# Patient Record
Sex: Female | Born: 2000 | Race: White | Hispanic: No | Marital: Single | Smoking: Never smoker
Health system: Southern US, Community
[De-identification: ages and names within clinical notes are randomized; demographics above are authoritative.]

## PROBLEM LIST (undated history)

## (undated) DIAGNOSIS — Z789 Other specified health status: Secondary | ICD-10-CM

## (undated) HISTORY — PX: NO PAST SURGERIES: SHX2092

---

## 2019-07-05 ENCOUNTER — Other Ambulatory Visit: Payer: Self-pay

## 2019-07-05 DIAGNOSIS — Z20822 Contact with and (suspected) exposure to covid-19: Secondary | ICD-10-CM

## 2019-07-07 LAB — NOVEL CORONAVIRUS, NAA: SARS-CoV-2, NAA: NOT DETECTED

## 2019-09-26 ENCOUNTER — Ambulatory Visit: Payer: Managed Care, Other (non HMO) | Attending: Internal Medicine

## 2019-09-26 ENCOUNTER — Other Ambulatory Visit: Payer: Self-pay

## 2019-09-26 DIAGNOSIS — Z20822 Contact with and (suspected) exposure to covid-19: Secondary | ICD-10-CM | POA: Insufficient documentation

## 2019-09-28 LAB — NOVEL CORONAVIRUS, NAA: SARS-CoV-2, NAA: NOT DETECTED

## 2019-10-13 ENCOUNTER — Ambulatory Visit: Payer: Managed Care, Other (non HMO) | Attending: Internal Medicine

## 2019-10-13 ENCOUNTER — Other Ambulatory Visit: Payer: Self-pay

## 2019-10-13 DIAGNOSIS — Z20822 Contact with and (suspected) exposure to covid-19: Secondary | ICD-10-CM | POA: Insufficient documentation

## 2019-10-14 LAB — NOVEL CORONAVIRUS, NAA: SARS-CoV-2, NAA: NOT DETECTED

## 2019-12-30 ENCOUNTER — Emergency Department (HOSPITAL_COMMUNITY)
Admission: EM | Admit: 2019-12-30 | Discharge: 2019-12-31 | Disposition: A | Discharge status: Home / Self Care | Payer: Managed Care, Other (non HMO) | Attending: Emergency Medicine | Admitting: Emergency Medicine

## 2019-12-30 ENCOUNTER — Other Ambulatory Visit: Payer: Self-pay

## 2019-12-30 ENCOUNTER — Encounter (HOSPITAL_COMMUNITY): Payer: Self-pay

## 2019-12-30 DIAGNOSIS — R42 Dizziness and giddiness: Secondary | ICD-10-CM | POA: Diagnosis not present

## 2019-12-30 DIAGNOSIS — N390 Urinary tract infection, site not specified: Secondary | ICD-10-CM | POA: Diagnosis not present

## 2019-12-30 DIAGNOSIS — R300 Dysuria: Secondary | ICD-10-CM | POA: Insufficient documentation

## 2019-12-30 DIAGNOSIS — N760 Acute vaginitis: Secondary | ICD-10-CM | POA: Diagnosis not present

## 2019-12-30 DIAGNOSIS — R1030 Lower abdominal pain, unspecified: Secondary | ICD-10-CM | POA: Diagnosis not present

## 2019-12-30 DIAGNOSIS — R102 Pelvic and perineal pain: Secondary | ICD-10-CM

## 2019-12-30 DIAGNOSIS — R110 Nausea: Secondary | ICD-10-CM | POA: Diagnosis not present

## 2019-12-30 DIAGNOSIS — K5900 Constipation, unspecified: Secondary | ICD-10-CM | POA: Diagnosis not present

## 2019-12-30 LAB — CBC
HCT: 37.8 % (ref 36.0–46.0)
Hemoglobin: 12.1 g/dL (ref 12.0–15.0)
MCH: 28.2 pg (ref 26.0–34.0)
MCHC: 32 g/dL (ref 30.0–36.0)
MCV: 88.1 fL (ref 80.0–100.0)
Platelets: 319 10*3/uL (ref 150–400)
RBC: 4.29 MIL/uL (ref 3.87–5.11)
RDW: 12.9 % (ref 11.5–15.5)
WBC: 9.5 10*3/uL (ref 4.0–10.5)
nRBC: 0 % (ref 0.0–0.2)

## 2019-12-30 LAB — URINALYSIS, ROUTINE W REFLEX MICROSCOPIC
Bacteria, UA: NONE SEEN
Bilirubin Urine: NEGATIVE
Glucose, UA: NEGATIVE mg/dL
Hgb urine dipstick: NEGATIVE
Ketones, ur: NEGATIVE mg/dL
Nitrite: NEGATIVE
Protein, ur: NEGATIVE mg/dL
Specific Gravity, Urine: 1.019 (ref 1.005–1.030)
pH: 5 (ref 5.0–8.0)

## 2019-12-30 LAB — BASIC METABOLIC PANEL
Anion gap: 13 (ref 5–15)
BUN: 11 mg/dL (ref 6–20)
CO2: 22 mmol/L (ref 22–32)
Calcium: 9.7 mg/dL (ref 8.9–10.3)
Chloride: 102 mmol/L (ref 98–111)
Creatinine, Ser: 0.89 mg/dL (ref 0.44–1.00)
GFR calc Af Amer: >60 mL/min (ref >60)
GFR calc non Af Amer: >60 mL/min (ref >60)
Glucose, Bld: 104 mg/dL — ABNORMAL HIGH (ref 70–99)
Potassium: 4.1 mmol/L (ref 3.5–5.1)
Sodium: 137 mmol/L (ref 135–145)

## 2019-12-30 LAB — I-STAT BETA HCG BLOOD, ED (MC, WL, AP ONLY): I-stat hCG, quantitative: <5 m[IU]/mL (ref <5)

## 2019-12-30 MED ORDER — SODIUM CHLORIDE 0.9% FLUSH: 3.0000 mL | Freq: Once | INTRAVENOUS | Status: DC

## 2019-12-30 NOTE — ED Notes (Signed)
States was going to car to get sweatshirt.

## 2019-12-30 NOTE — ED Triage Notes (Signed)
Pt reports that she was at work and then began to have pelvic pain, dysuria and hot flashes and some nausea feeling like she might pass out.

## 2019-12-31 ENCOUNTER — Emergency Department (HOSPITAL_COMMUNITY): Payer: Managed Care, Other (non HMO)

## 2019-12-31 ENCOUNTER — Encounter (HOSPITAL_COMMUNITY): Payer: Self-pay | Admitting: Emergency Medicine

## 2019-12-31 LAB — WET PREP, GENITAL
Sperm: NONE SEEN
Trich, Wet Prep: NONE SEEN
Yeast Wet Prep HPF POC: NONE SEEN

## 2019-12-31 MED ORDER — NITROFURANTOIN MONOHYD MACRO 100 MG PO CAPS
100.0000 mg | ORAL_CAPSULE | Freq: Two times a day (BID) | ORAL | 0 refills | Status: DC
Start: 2019-12-31 — End: 2023-02-21

## 2019-12-31 MED ORDER — NAPROXEN 250 MG PO TABS
500.0000 mg | ORAL_TABLET | Freq: Once | ORAL | Status: AC
  Administered 2019-12-31: 500 mg via ORAL
  Filled 2019-12-31: qty 2

## 2019-12-31 MED ORDER — NITROFURANTOIN MONOHYD MACRO 100 MG PO CAPS
100.0000 mg | ORAL_CAPSULE | Freq: Once | ORAL | Status: AC
  Administered 2019-12-31: 100 mg via ORAL
  Filled 2019-12-31: qty 1

## 2019-12-31 MED ORDER — METRONIDAZOLE 500 MG PO TABS
500.0000 mg | ORAL_TABLET | Freq: Two times a day (BID) | ORAL | 0 refills | Status: DC
Start: 2019-12-31 — End: 2023-02-21

## 2019-12-31 MED ORDER — ACETAMINOPHEN 325 MG PO TABS
650.0000 mg | ORAL_TABLET | Freq: Once | ORAL | Status: AC
  Administered 2019-12-31: 650 mg via ORAL
  Filled 2019-12-31: qty 2

## 2019-12-31 MED ORDER — METRONIDAZOLE 500 MG PO TABS
500.0000 mg | ORAL_TABLET | Freq: Once | ORAL | Status: AC
  Administered 2019-12-31: 500 mg via ORAL
  Filled 2019-12-31: qty 1

## 2019-12-31 NOTE — ED Notes (Signed)
Patient verbalizes understanding of discharge instructions. Opportunity for questioning and answers were provided. Armband removed by staff, pt discharged from ED. Pt. ambulatory and discharged home.  

## 2019-12-31 NOTE — ED Provider Notes (Signed)
[facility] EMERGENCY DEPARTMENT Provider Note   CSN: [id number] Arrival date & time: 12/30/19  1951     History Chief Complaint  Patient presents with  . Pelvic Pain  . Near Syncope    Paula Lang is a 19 y.o. female.  The history is provided by the patient.  Abdominal Pain Pain location:  Suprapubic Pain quality: aching   Pain radiates to:  Anus Pain severity:  Moderate Onset quality:  Sudden Timing:  Constant Progression:  Improving Chronicity:  New Context: not retching, not sick contacts and not suspicious food intake   Relieved by:  Nothing Worsened by:  Nothing Ineffective treatments:  None tried Associated symptoms: dysuria and nausea   Associated symptoms: no anorexia, no chest pain, no chills, no cough, no diarrhea, no flatus, no hematemesis, no hematochezia, no hematuria, no vaginal bleeding, no vaginal discharge and no vomiting   Risk factors: pregnancy   Risk factors: has not had multiple surgeries        History reviewed. No pertinent past medical history.  There are no problems to display for this patient.   History reviewed. No pertinent surgical history.   OB History   No obstetric history on file.     History reviewed. No pertinent family history.  Social History   Tobacco Use  . Smoking status: Never Smoker  . Smokeless tobacco: Never Used  Substance Use Topics  . Alcohol use: Never  . Drug use: Yes    Types: Marijuana    Home Medications Prior to Admission medications   Not on File    Allergies    Patient has no known allergies.  Review of Systems   Review of Systems  Constitutional: Negative for chills.  HENT: Negative for congestion.   Eyes: Negative for visual disturbance.  Respiratory: Negative for cough.   Cardiovascular: Negative for chest pain.  Gastrointestinal: Positive for abdominal pain and nausea. Negative for anorexia, diarrhea, flatus, hematemesis, hematochezia and vomiting.    Genitourinary: Positive for dysuria and pelvic pain. Negative for flank pain, hematuria, vaginal bleeding and vaginal discharge.  Musculoskeletal: Negative for arthralgias.  Skin: Negative for rash.  Neurological: Positive for light-headedness. Negative for dizziness.  Psychiatric/Behavioral: Negative for agitation.  All other systems reviewed and are negative.   Physical Exam Updated Vital Signs BP 123/78   Pulse 71   Temp 98.4 F (36.9 C) (Oral)   Resp 18   LMP 12/18/2019   SpO2 100%   Physical Exam Vitals and nursing note reviewed. Exam conducted with a chaperone present.  Constitutional:      General: She is not in acute distress.    Appearance: Normal appearance.  HENT:     Head: Normocephalic and atraumatic.     Nose: Nose normal.  Eyes:     Pupils: Pupils are equal, round, and reactive to light.  Cardiovascular:     Rate and Rhythm: Normal rate and regular rhythm.     Pulses: Normal pulses.     Heart sounds: Normal heart sounds.  Pulmonary:     Effort: Pulmonary effort is normal.     Breath sounds: Normal breath sounds.  Abdominal:     General: Abdomen is flat. Bowel sounds are normal.     Tenderness: There is no abdominal tenderness. There is no guarding.  Genitourinary:    Vagina: No vaginal discharge.     Cervix: Discharge and cervical bleeding present. No cervical motion tenderness or friability.     Adnexa: Right adnexa  normal and left adnexa normal.  Musculoskeletal:     Cervical back: Normal range of motion and neck supple.  Skin:    General: Skin is warm and dry.     Capillary Refill: Capillary refill takes less than 2 seconds.  Neurological:     General: No focal deficit present.     Mental Status: She is alert and oriented to person, place, and time.     Deep Tendon Reflexes: Reflexes normal.  Psychiatric:        Mood and Affect: Mood normal.        Behavior: Behavior normal.     ED Results / Procedures / Treatments   Labs (all labs  ordered are listed, but only abnormal results are displayed) Results for orders placed or performed during the hospital encounter of 12/30/19  Wet prep, genital   Specimen: Cervix  Result Value Ref Range   Yeast Wet Prep HPF POC NONE SEEN NONE SEEN   Trich, Wet Prep NONE SEEN NONE SEEN   Clue Cells Wet Prep HPF POC PRESENT (A) NONE SEEN   WBC, Wet Prep HPF POC FEW (A) NONE SEEN   Sperm NONE SEEN   Basic metabolic panel  Result Value Ref Range   Sodium 137 135 - 145 mmol/L   Potassium 4.1 3.5 - 5.1 mmol/L   Chloride 102 98 - 111 mmol/L   CO2 22 22 - 32 mmol/L   Glucose, Bld 104 (H) 70 - 99 mg/dL   BUN 11 6 - 20 mg/dL   Creatinine, Ser 9.39 0.44 - 1.00 mg/dL   Calcium 9.7 8.9 - 03.0 mg/dL   GFR calc non Af Amer >60 >60 mL/min   GFR calc Af Amer >60 >60 mL/min   Anion gap 13 5 - 15  CBC  Result Value Ref Range   WBC 9.5 4.0 - 10.5 K/uL   RBC 4.29 3.87 - 5.11 MIL/uL   Hemoglobin 12.1 12.0 - 15.0 g/dL   HCT 09.2 33.0 - 07.6 %   MCV 88.1 80.0 - 100.0 fL   MCH 28.2 26.0 - 34.0 pg   MCHC 32.0 30.0 - 36.0 g/dL   RDW 22.6 33.3 - 54.5 %   Platelets 319 150 - 400 K/uL   nRBC 0.0 0.0 - 0.2 %  Urinalysis, Routine w reflex microscopic  Result Value Ref Range   Color, Urine YELLOW YELLOW   APPearance HAZY (A) CLEAR   Specific Gravity, Urine 1.019 1.005 - 1.030   pH 5.0 5.0 - 8.0   Glucose, UA NEGATIVE NEGATIVE mg/dL   Hgb urine dipstick NEGATIVE NEGATIVE   Bilirubin Urine NEGATIVE NEGATIVE   Ketones, ur NEGATIVE NEGATIVE mg/dL   Protein, ur NEGATIVE NEGATIVE mg/dL   Nitrite NEGATIVE NEGATIVE   Leukocytes,Ua SMALL (A) NEGATIVE   WBC, UA 11-20 0 - 5 WBC/hpf   Bacteria, UA NONE SEEN NONE SEEN   Squamous Epithelial / LPF 0-5 0 - 5  I-Stat beta hCG blood, ED  Result Value Ref Range   I-stat hCG, quantitative <5.0 <5 mIU/mL   Comment 3           DG Abdomen Acute W/Chest  Result Date: 12/31/2019 CLINICAL DATA:  Lower abdominal pain, dysuria and hot flashes EXAM: DG ABDOMEN ACUTE W/  1V CHEST COMPARISON:  None. FINDINGS: No consolidation, features of edema, pneumothorax, or effusion. Pulmonary vascularity is normally distributed. The cardiomediastinal contours are unremarkable. No high-grade obstructive bowel gas pattern. Moderate volume of stool in the right hemicolon. More  distal colon is largely decompressed with air and stool over the rectal vault. No suspicious calcifications in the abdomen. No evidence of subdiaphragmatic free air. No portal venous gas. No acute osseous or soft tissue abnormality. IMPRESSION: 1. No acute cardiopulmonary disease. 2. Moderate volume of stool in the right hemicolon. No evidence of obstruction or free air. Electronically Signed   By: Kreg Shropshire M.D.   On: 12/31/2019 02:33   US PELVIC COMPLETE W TRANSVAGINAL AND TORSION R/O  Result Date: 12/31/2019 CLINICAL DATA:  Initial evaluation for acute pelvic pain. EXAM: TRANSABDOMINAL AND TRANSVAGINAL ULTRASOUND OF PELVIS DOPPLER ULTRASOUND OF OVARIES TECHNIQUE: Both transabdominal and transvaginal ultrasound examinations of the pelvis were performed. Transabdominal technique was performed for global imaging of the pelvis including uterus, ovaries, adnexal regions, and pelvic cul-de-sac. It was necessary to proceed with endovaginal exam following the transabdominal exam to visualize the uterus, endometrium, and ovaries. Color and duplex Doppler ultrasound was utilized to evaluate blood flow to the ovaries. COMPARISON:  None. FINDINGS: Uterus Measurements: 7.0 x 3.3 x 4.4 cm = volume: 53.0 mL. No fibroids or other mass visualized. Endometrium Thickness: 5 mm.  No focal abnormality visualized. Right ovary Measurements: 3.2 x 1.9 x 2.1 cm = volume: 6.3 mL. Normal appearance/no adnexal mass. Left ovary Measurements: 2.2 x 1.8 x 1.7 cm = volume: 3.7 mL. Normal appearance/no adnexal mass. Pulsed Doppler evaluation of both ovaries demonstrates normal low-resistance arterial and venous waveforms. Other findings No  abnormal free fluid. IMPRESSION: Normal pelvic ultrasound. No evidence for torsion or other abnormality. Electronically Signed   By: Rise Mu M.D.   On: 12/31/2019 03:04    EKG EKG Interpretation  Date/Time:  Saturday Dec 30 2019 20:08:15 EDT Ventricular Rate:  57 PR Interval:  142 QRS Duration: 84 QT Interval:  396 QTC Calculation: 385 R Axis:   83 Text Interpretation: Sinus bradycardia Confirmed by Oshay Stranahan ([id number]) on 12/30/2019 11:39:02 PM   Radiology No results found.  Procedures Procedures (including critical care time)  Medications Ordered in ED Medications  sodium chloride flush (NS) 0.9 % injection 3 mL (has no administration in time range)  naproxen (NAPROSYN) tablet 500 mg (has no administration in time range)  acetaminophen (TYLENOL) tablet 650 mg (has no administration in time range)  nitrofurantoin (macrocrystal-monohydrate) (MACROBID) capsule 100 mg (has no administration in time range)  metroNIDAZOLE (FLAGYL) tablet 500 mg (has no administration in time range)    ED Course  I have reviewed the triage vital signs and the nursing notes.  Pertinent labs & imaging results that were available during my care of the patient were reviewed by me and considered in my medical decision making (see chart for details).   Will treat for constipation with miralax, UTI, and bacterial vaginosis.  No acute finding.  Stable for discharge with close.   Paula Lang was evaluated in Emergency Department on 12/31/2019 for the symptoms described in the history of present illness. She was evaluated in the context of the global COVID-19 pandemic, which necessitated consideration that the patient might be at risk for infection with the SARS-CoV-2 virus that causes COVID-19. Institutional protocols and algorithms that pertain to the evaluation of patients at risk for COVID-19 are in a state of rapid change based on information released by regulatory bodies including the CDC  and federal and state organizations. These policies and algorithms were followed during the patient's care in the ED.  Final Clinical Impression(s) / ED Diagnoses Return for intractable cough, coughing up blood,fevers >100.4  unrelieved by medication, shortness of breath, intractable vomiting, chest pain, shortness of breath, weakness,numbness, changes in speech, facial asymmetry,abdominal pain, passing out,Inability to tolerate liquids or food, cough, altered mental status or any concerns. No signs of systemic illness or infection. The patient is nontoxic-appearing on exam and vital signs are within normal limits.   I have reviewed the triage vital signs and the nursing notes. Pertinent labs &imaging results that were available during my care of the patient were reviewed by me and considered in my medical decision making (see chart for details).After history, exam, and medical workup I feel the patient has beenappropriately medically screened and is safe for discharge home. Pertinent diagnoses were discussed with the patient. Patient was given return precautions.   Kenijah Benningfield, MD 12/31/19 [phone]

## 2020-01-01 LAB — GC/CHLAMYDIA PROBE AMP (~~LOC~~) NOT AT ARMC
Chlamydia: NEGATIVE
Comment: NEGATIVE
Comment: NORMAL
Neisseria Gonorrhea: NEGATIVE

## 2022-01-19 IMAGING — CR DG ABDOMEN ACUTE W/ 1V CHEST
3 series · 3 of 3 positions shown · non-contrast
Comparison: None.

CLINICAL DATA: Lower abdominal pain, dysuria and hot flashes

EXAM:
DG ABDOMEN ACUTE W/ 1V CHEST

[chest pa]
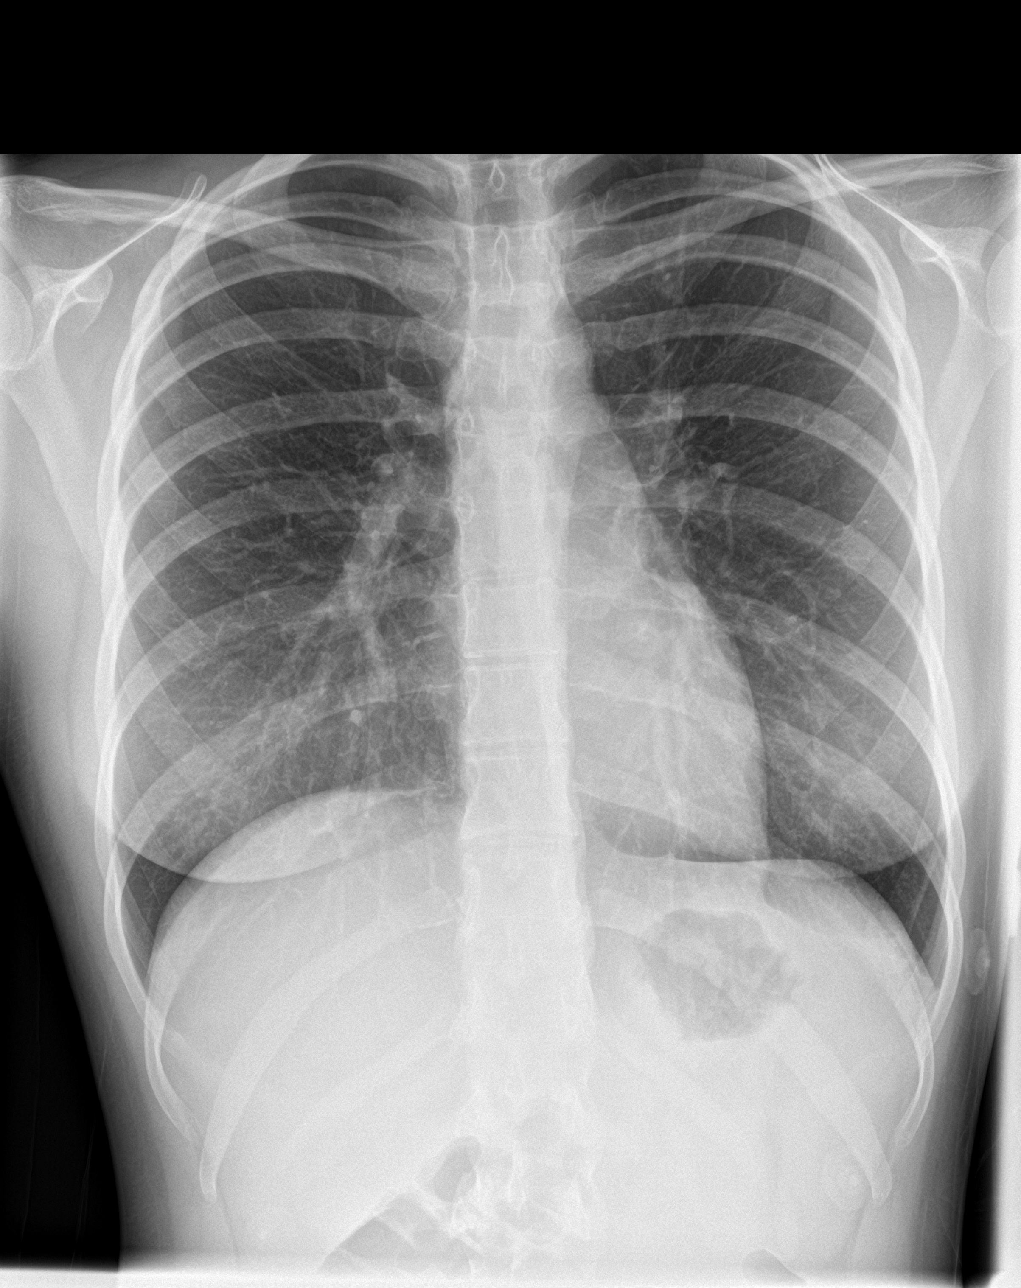

[abdomen erect]
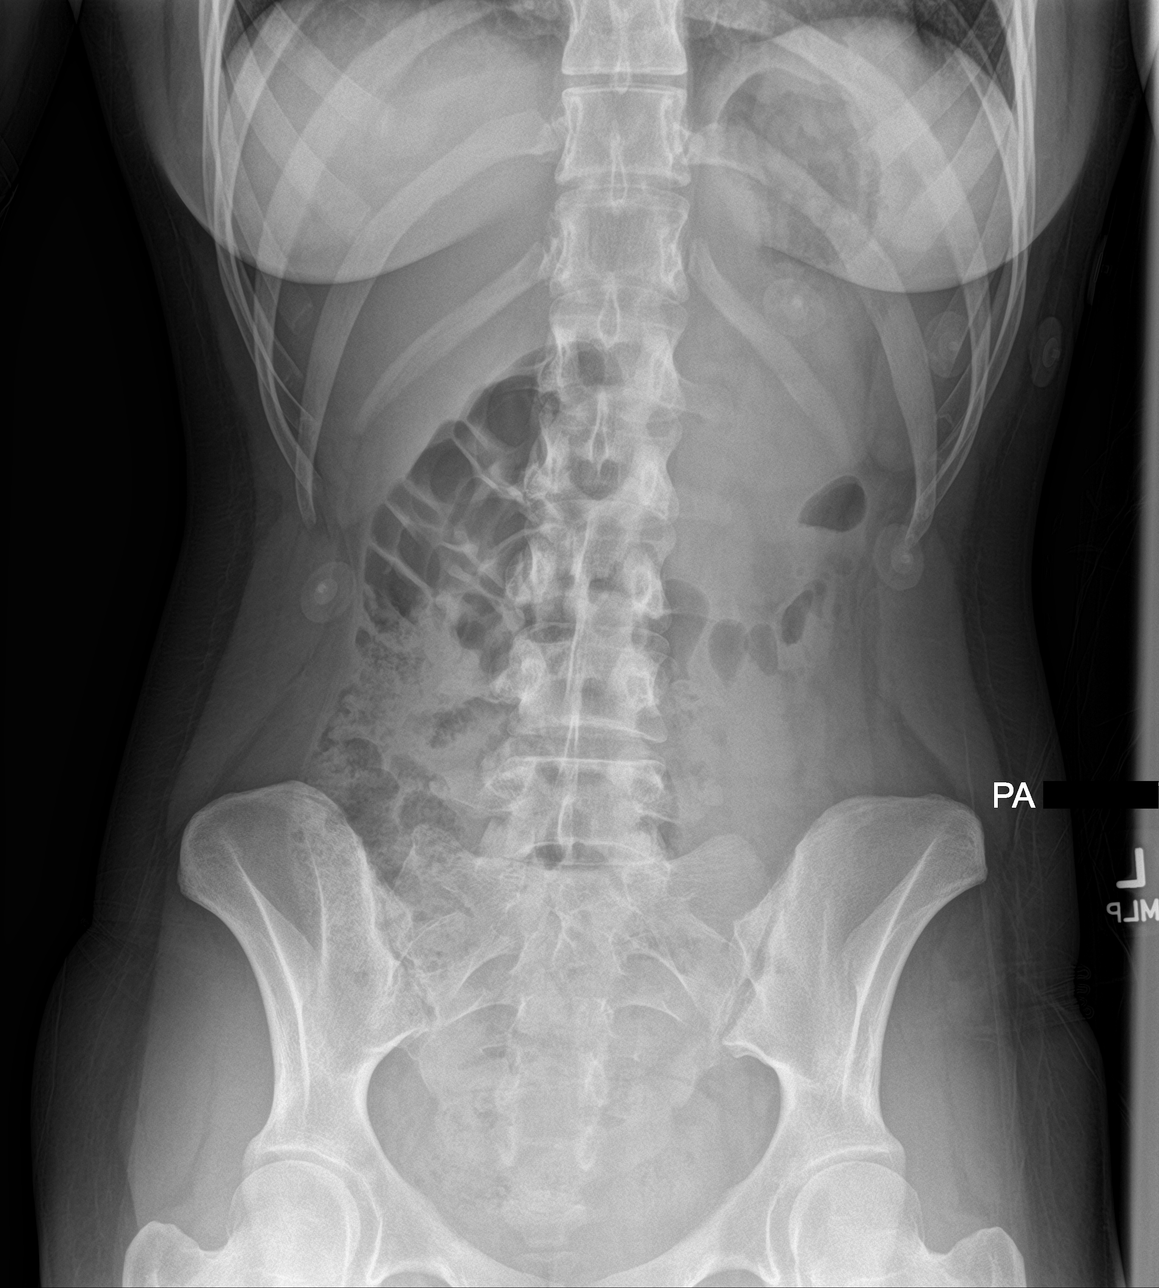

[abdomen supine]
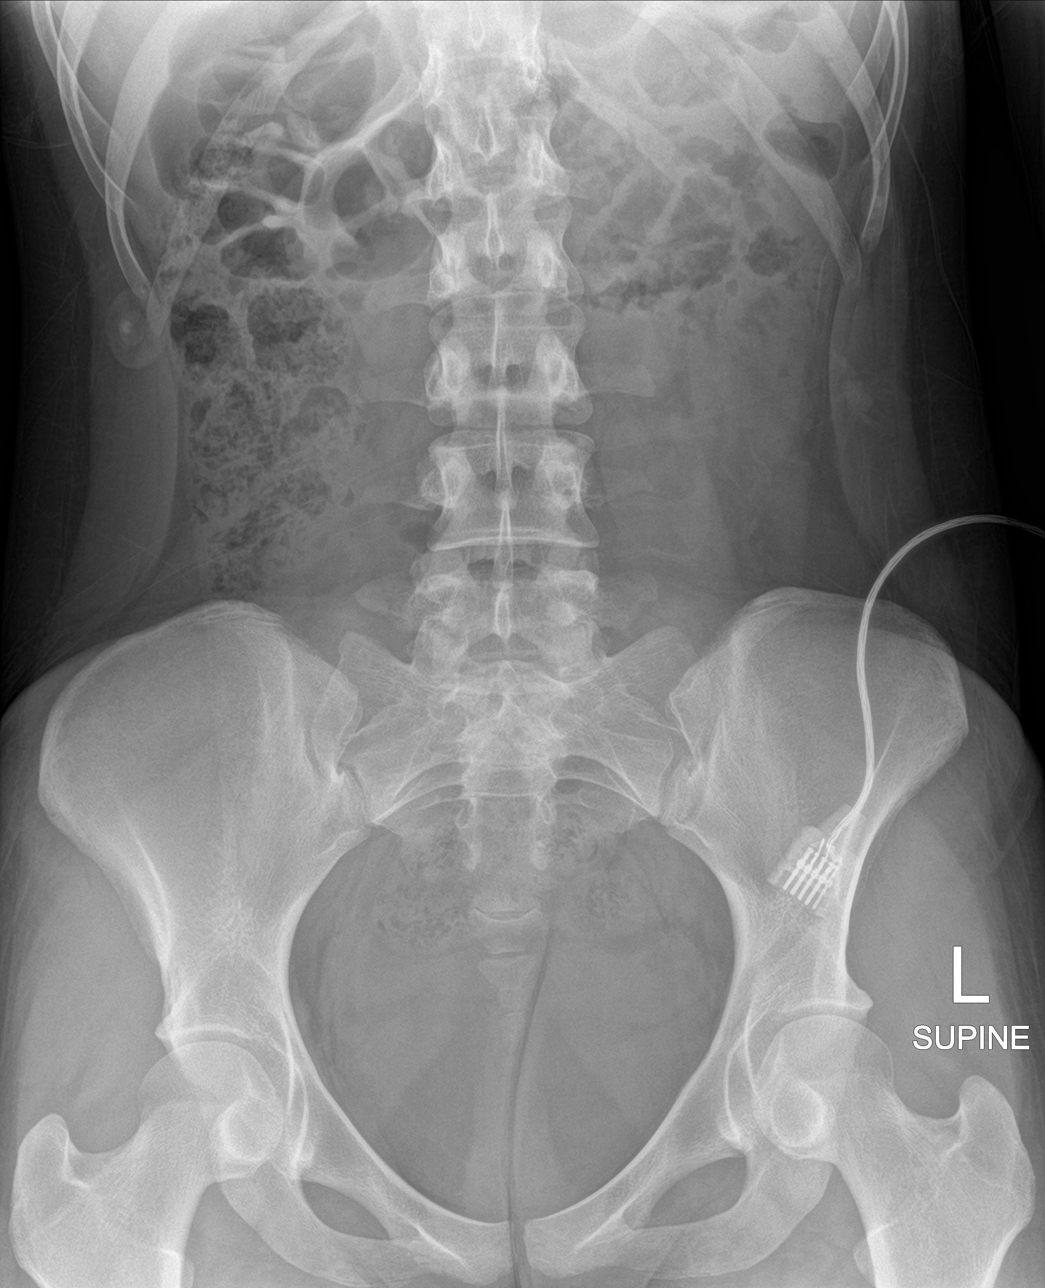

[3 of 3 positions shown; findings below may reference images not displayed]

FINDINGS: No consolidation, features of edema, pneumothorax, or effusion.
Pulmonary vascularity is normally distributed. The cardiomediastinal
contours are unremarkable. No high-grade obstructive bowel gas
pattern. Moderate volume of stool in the right hemicolon. More
distal colon is largely decompressed with air and stool over the
rectal vault. No suspicious calcifications in the abdomen. No
evidence of subdiaphragmatic free air. No portal venous gas. No
acute osseous or soft tissue abnormality.
IMPRESSION: 1. No acute cardiopulmonary disease.
2. Moderate volume of stool in the right hemicolon. No evidence of
obstruction or free air.

## 2022-08-10 NOTE — L&D Delivery Note (Signed)
Delivery Note At 1:18 AM a viable female was delivered via Vaginal, Spontaneous (Presentation: Right Occiput Anterior).  APGAR: per nursing, good tone, color, cry , ; weight  .   Placenta status: Spontaneous, Intact.  Cord:   with the following complications: none .  Cord pH: n/a  Anesthesia: Epidural Episiotomy:  none Lacerations:  none Suture Repair:  none needed Est. Blood Loss (mL):  100  Mom to postpartum.  Baby to Couplet care / Skin to Skin.  [name] 02/19/2023, 1:27 AM

## 2022-08-18 LAB — OB RESULTS CONSOLE HEPATITIS B SURFACE ANTIGEN: Hepatitis B Surface Ag: NEGATIVE

## 2022-08-18 LAB — OB RESULTS CONSOLE GC/CHLAMYDIA
Chlamydia: NEGATIVE
Neisseria Gonorrhea: NEGATIVE

## 2022-08-18 LAB — OB RESULTS CONSOLE HIV ANTIBODY (ROUTINE TESTING): HIV: NONREACTIVE

## 2022-08-18 LAB — OB RESULTS CONSOLE VARICELLA ZOSTER ANTIBODY, IGG: Varicella: IMMUNE

## 2022-08-18 LAB — OB RESULTS CONSOLE RUBELLA ANTIBODY, IGM: Rubella: IMMUNE

## 2022-08-18 LAB — OB RESULTS CONSOLE HGB/HCT, BLOOD
HCT: 37 (ref 29–41)
Hemoglobin: 12.6

## 2022-08-18 LAB — OB RESULTS CONSOLE PLATELET COUNT: Platelets: 286

## 2022-12-10 LAB — OB RESULTS CONSOLE PLATELET COUNT: Platelets: 219

## 2022-12-10 LAB — OB RESULTS CONSOLE HGB/HCT, BLOOD
HCT: 32 (ref 29–41)
Hemoglobin: 11.2

## 2023-01-21 ENCOUNTER — Inpatient Hospital Stay (HOSPITAL_COMMUNITY): Payer: Managed Care, Other (non HMO)

## 2023-01-21 ENCOUNTER — Encounter (HOSPITAL_COMMUNITY): Payer: Self-pay | Admitting: Obstetrics and Gynecology

## 2023-01-21 ENCOUNTER — Inpatient Hospital Stay (HOSPITAL_COMMUNITY)
Admission: AD | Admit: 2023-01-21 | Discharge: 2023-01-21 | Disposition: A | Discharge status: Home / Self Care | Payer: Managed Care, Other (non HMO) | Attending: Obstetrics and Gynecology | Admitting: Obstetrics and Gynecology

## 2023-01-21 DIAGNOSIS — O368130 Decreased fetal movements, third trimester, not applicable or unspecified: Secondary | ICD-10-CM

## 2023-01-21 DIAGNOSIS — Z3A33 33 weeks gestation of pregnancy: Secondary | ICD-10-CM

## 2023-01-21 DIAGNOSIS — O283 Abnormal ultrasonic finding on antenatal screening of mother: Secondary | ICD-10-CM | POA: Diagnosis not present

## 2023-01-21 DIAGNOSIS — Z3689 Encounter for other specified antenatal screening: Secondary | ICD-10-CM

## 2023-01-21 DIAGNOSIS — Z3493 Encounter for supervision of normal pregnancy, unspecified, third trimester: Secondary | ICD-10-CM

## 2023-01-21 HISTORY — DX: Other specified health status: Z78.9

## 2023-01-21 NOTE — MAU Note (Signed)
.  Paula Lang is a 22 y.o. at [redacted]w[redacted]d here in MAU reporting: goes to [facility] for [facility] In office today for DFM; pt reports sent to MAU for BPP and NST; patient reports failed BPP but passed NST in office. Patient reports continued decreased fetal movement, reports x2 movements felt in past hour. Denies VB and LOF  Onset of complaint: Today Pain score: 0 Vitals:   01/21/23 2115  BP: 132/87  Pulse: (!) 120  Resp: 19  Temp: 98.3 F (36.8 C)  SpO2: 100%     FHT:150 Lab orders placed from triage:  n/a

## 2023-01-21 NOTE — Discharge Instructions (Signed)

## 2023-01-21 NOTE — MAU Provider Note (Signed)
History     CSN: [id number]  Arrival date and time: 01/21/23 2026   Event Date/Time   First Provider Initiated Contact with Patient 01/21/23 2121      Chief Complaint  Patient presents with   Non-stress Test   Decreased Fetal Movement   HPI  Paula Lang is a 22 y.o. G1P0 at [redacted]w[redacted]d who presents for evaluation of decreased fetal movement. Patient reports she was in the office this afternoon and reported decreased fetal movement. She states she had a reactive NST but failed the BPP in the office. She reports it was -2 points for breathing. She states she was instructed to come to MAU 4 hours after her appointment for repeat NST/BPP.   Patient reports normal fetal movement in MAU. Denies any pain.  She denies any vaginal bleeding, discharge, and leaking of fluid. Denies any constipation, diarrhea or any urinary complaints.   OB History     Gravida  1   Para      Term      Preterm      AB      Living         SAB      IAB      Ectopic      Multiple      Live Births              Past Medical History:  Diagnosis Date   Medical history non-contributory     Past Surgical History:  Procedure Laterality Date   NO PAST SURGERIES      History reviewed. No pertinent family history.  Social History   Tobacco Use   Smoking status: Never   Smokeless tobacco: Never  Substance Use Topics   Alcohol use: Never   Drug use: Not Currently    Types: Marijuana    Allergies: No Known Allergies  No medications prior to admission.    Review of Systems  Constitutional: Negative.  Negative for fatigue and fever.  HENT: Negative.    Respiratory: Negative.  Negative for shortness of breath.   Cardiovascular: Negative.  Negative for chest pain.  Gastrointestinal: Negative.  Negative for abdominal pain, constipation, diarrhea, nausea and vomiting.  Genitourinary: Negative.  Negative for dysuria, vaginal bleeding and vaginal discharge.  Neurological: Negative.   Negative for dizziness and headaches.   Physical Exam   Blood pressure 129/85, pulse 94, temperature 98.3 F (36.8 C), temperature source Oral, resp. rate 19, height 5\' 6"  (1.676 m), weight 95.4 kg, SpO2 100 %.  Patient Vitals for the past 24 hrs:  BP Temp Temp src Pulse Resp SpO2 Height Weight  01/21/23 2230 129/85 -- -- 94 -- 100 % -- --  01/21/23 2225 -- -- -- -- -- 100 % -- --  01/21/23 2135 -- -- -- -- -- 99 % -- --  01/21/23 2130 -- -- -- -- -- 100 % -- --  01/21/23 2125 -- -- -- -- -- 99 % -- --  01/21/23 2120 -- -- -- -- -- 99 % -- --  01/21/23 2115 132/87 98.3 F (36.8 C) Oral (!) 120 19 100 % 5\' 6"  (1.676 m) 95.4 kg    Physical Exam Vitals and nursing note reviewed.  Constitutional:      General: She is not in acute distress.    Appearance: She is well-developed.  HENT:     Head: Normocephalic.  Eyes:     Pupils: Pupils are equal, round, and reactive to light.  Cardiovascular:  Rate and Rhythm: Normal rate and regular rhythm.     Heart sounds: Normal heart sounds.  Pulmonary:     Effort: Pulmonary effort is normal. No respiratory distress.     Breath sounds: Normal breath sounds.  Abdominal:     General: Bowel sounds are normal. There is no distension.     Palpations: Abdomen is soft.     Tenderness: There is no abdominal tenderness.  Skin:    General: Skin is warm and dry.  Neurological:     Mental Status: She is alert and oriented to person, place, and time.  Psychiatric:        Mood and Affect: Mood normal.        Behavior: Behavior normal.        Thought Content: Thought content normal.        Judgment: Judgment normal.     Fetal Tracing:  Baseline: 130 Variability: moderate Accels: 15x15 Decels: none  Toco: none      MAU Course  Procedures  MDM Prenatal records from community office reviewed. Pregnancy uncomplicated Labs ordered and reviewed.   NST reactive [place] MFM BPP- 8/8 with normal AFI Patient reports normal fetal movement.    Dr. Amado Nash notified of normal repeat BPP and MD states patient can follow up as scheduled in office.  Assessment and Plan   1. NST (non-stress test) reactive   2. Movement of fetus present during pregnancy in third trimester   3. [redacted] weeks gestation of pregnancy     -Discharge home in stable condition -Third trimester precautions discussed -Patient advised to follow-up with OB as scheduled for prenatal care -Patient may return to MAU as needed or if her condition were to change or worsen  Rolm Bookbinder, CNM 01/22/2023, 9:21 PM

## 2023-02-18 ENCOUNTER — Inpatient Hospital Stay (HOSPITAL_COMMUNITY)
Admission: AD | Admit: 2023-02-18 | Discharge: 2023-02-21 | DRG: 806 | Disposition: A | Discharge status: Home / Self Care | Payer: Managed Care, Other (non HMO) | Attending: Obstetrics and Gynecology | Admitting: Obstetrics and Gynecology

## 2023-02-18 ENCOUNTER — Inpatient Hospital Stay (HOSPITAL_COMMUNITY): Payer: Managed Care, Other (non HMO) | Admitting: Anesthesiology

## 2023-02-18 ENCOUNTER — Encounter (HOSPITAL_COMMUNITY): Payer: Self-pay | Admitting: Obstetrics and Gynecology

## 2023-02-18 ENCOUNTER — Other Ambulatory Visit: Payer: Self-pay

## 2023-02-18 DIAGNOSIS — O134 Gestational [pregnancy-induced] hypertension without significant proteinuria, complicating childbirth: Secondary | ICD-10-CM | POA: Diagnosis present

## 2023-02-18 DIAGNOSIS — D62 Acute posthemorrhagic anemia: Secondary | ICD-10-CM | POA: Diagnosis not present

## 2023-02-18 DIAGNOSIS — O9081 Anemia of the puerperium: Secondary | ICD-10-CM | POA: Diagnosis not present

## 2023-02-18 DIAGNOSIS — O9902 Anemia complicating childbirth: Secondary | ICD-10-CM | POA: Diagnosis not present

## 2023-02-18 DIAGNOSIS — Z3A37 37 weeks gestation of pregnancy: Secondary | ICD-10-CM | POA: Diagnosis not present

## 2023-02-18 DIAGNOSIS — O3663X0 Maternal care for excessive fetal growth, third trimester, not applicable or unspecified: Secondary | ICD-10-CM | POA: Diagnosis present

## 2023-02-18 DIAGNOSIS — O1493 Unspecified pre-eclampsia, third trimester: Principal | ICD-10-CM

## 2023-02-18 DIAGNOSIS — O163 Unspecified maternal hypertension, third trimester: Secondary | ICD-10-CM | POA: Diagnosis present

## 2023-02-18 DIAGNOSIS — O139 Gestational [pregnancy-induced] hypertension without significant proteinuria, unspecified trimester: Secondary | ICD-10-CM | POA: Diagnosis present

## 2023-02-18 LAB — HIV ANTIBODY (ROUTINE TESTING W REFLEX): HIV Screen 4th Generation wRfx: NONREACTIVE

## 2023-02-18 LAB — PROTEIN / CREATININE RATIO, URINE
Creatinine, Urine: 22 mg/dL
Total Protein, Urine: <6 mg/dL

## 2023-02-18 LAB — COMPREHENSIVE METABOLIC PANEL
ALT: 50 U/L — ABNORMAL HIGH (ref 0–44)
AST: 37 U/L (ref 15–41)
Albumin: 2.8 g/dL — ABNORMAL LOW (ref 3.5–5.0)
Alkaline Phosphatase: 123 U/L (ref 38–126)
Anion gap: 11 (ref 5–15)
BUN: 6 mg/dL (ref 6–20)
CO2: 20 mmol/L — ABNORMAL LOW (ref 22–32)
Calcium: 9.7 mg/dL (ref 8.9–10.3)
Chloride: 102 mmol/L (ref 98–111)
Creatinine, Ser: 0.73 mg/dL (ref 0.44–1.00)
GFR, Estimated: >60 mL/min (ref >60)
Glucose, Bld: 88 mg/dL (ref 70–99)
Potassium: 3.9 mmol/L (ref 3.5–5.1)
Sodium: 133 mmol/L — ABNORMAL LOW (ref 135–145)
Total Bilirubin: 0.4 mg/dL (ref 0.3–1.2)
Total Protein: 7 g/dL (ref 6.5–8.1)

## 2023-02-18 LAB — CBC
HCT: 32.5 % — ABNORMAL LOW (ref 36.0–46.0)
HCT: 33.5 % — ABNORMAL LOW (ref 36.0–46.0)
Hemoglobin: 10.7 g/dL — ABNORMAL LOW (ref 12.0–15.0)
Hemoglobin: 10.9 g/dL — ABNORMAL LOW (ref 12.0–15.0)
MCH: 28.6 pg (ref 26.0–34.0)
MCH: 28.3 pg (ref 26.0–34.0)
MCHC: 32.9 g/dL (ref 30.0–36.0)
MCHC: 32.5 g/dL (ref 30.0–36.0)
MCV: 86.9 fL (ref 80.0–100.0)
MCV: 87 fL (ref 80.0–100.0)
Platelets: 273 10*3/uL (ref 150–400)
Platelets: 247 10*3/uL (ref 150–400)
RBC: 3.74 MIL/uL — ABNORMAL LOW (ref 3.87–5.11)
RBC: 3.85 MIL/uL — ABNORMAL LOW (ref 3.87–5.11)
RDW: 12.3 % (ref 11.5–15.5)
RDW: 12.4 % (ref 11.5–15.5)
WBC: 11.3 10*3/uL — ABNORMAL HIGH (ref 4.0–10.5)
WBC: 8.8 10*3/uL (ref 4.0–10.5)
nRBC: 0 % (ref 0.0–0.2)
nRBC: 0 % (ref 0.0–0.2)

## 2023-02-18 LAB — TYPE AND SCREEN
ABO/RH(D): O POS
Antibody Screen: NEGATIVE

## 2023-02-18 LAB — RPR: RPR Ser Ql: NONREACTIVE

## 2023-02-18 LAB — OB RESULTS CONSOLE GBS: GBS: NEGATIVE

## 2023-02-18 MED ORDER — OXYTOCIN BOLUS FROM INFUSION
333.0000 mL | Freq: Once | INTRAVENOUS | Status: DC
  Administered 2023-02-19: 333 mL via INTRAVENOUS

## 2023-02-18 MED ORDER — FENTANYL-BUPIVACAINE-NACL 0.5-0.125-0.9 MG/250ML-% EP SOLN: 12.0000 mL/h | EPIDURAL | Status: DC | PRN

## 2023-02-18 MED ORDER — SOD CITRATE-CITRIC ACID 500-334 MG/5ML PO SOLN: 30.0000 mL | ORAL | Status: DC | PRN

## 2023-02-18 MED ORDER — MISOPROSTOL 25 MCG QUARTER TABLET
25.0000 ug | ORAL_TABLET | Freq: Once | ORAL | Status: AC
  Administered 2023-02-18: 25 ug via VAGINAL
  Filled 2023-02-18: qty 1

## 2023-02-18 MED ORDER — LACTATED RINGERS IV SOLN: INTRAVENOUS | Status: DC

## 2023-02-18 MED ORDER — ACETAMINOPHEN 325 MG PO TABS
ORAL_TABLET | ORAL | Status: AC
  Administered 2023-02-18: 650 mg via ORAL
  Filled 2023-02-18: qty 2

## 2023-02-18 MED ORDER — EPHEDRINE 5 MG/ML INJ: 10.0000 mg | INTRAVENOUS | Status: DC | PRN

## 2023-02-18 MED ORDER — ACETAMINOPHEN 325 MG PO TABS
650.0000 mg | ORAL_TABLET | ORAL | Status: DC | PRN
  Administered 2023-02-18 – 2023-02-19 (×2): 650 mg via ORAL
  Filled 2023-02-18 (×2): qty 2

## 2023-02-18 MED ORDER — DIPHENHYDRAMINE HCL 50 MG/ML IJ SOLN: 12.5000 mg | INTRAMUSCULAR | Status: DC | PRN

## 2023-02-18 MED ORDER — LIDOCAINE HCL (PF) 1 % IJ SOLN
INTRAMUSCULAR | Status: DC | PRN
  Administered 2023-02-18: 8 mL via EPIDURAL

## 2023-02-18 MED ORDER — PHENYLEPHRINE 80 MCG/ML (10ML) SYRINGE FOR IV PUSH (FOR BLOOD PRESSURE SUPPORT)
80.0000 ug | PREFILLED_SYRINGE | INTRAVENOUS | Status: DC | PRN
  Filled 2023-02-18: qty 10

## 2023-02-18 MED ORDER — TERBUTALINE SULFATE 1 MG/ML IJ SOLN: 0.2500 mg | Freq: Once | INTRAMUSCULAR | Status: DC | PRN

## 2023-02-18 MED ORDER — MISOPROSTOL 25 MCG QUARTER TABLET: 25.0000 ug | ORAL_TABLET | Freq: Once | ORAL | Status: DC

## 2023-02-18 MED ORDER — OXYTOCIN-SODIUM CHLORIDE 30-0.9 UT/500ML-% IV SOLN
2.5000 [IU]/h | INTRAVENOUS | Status: DC
  Administered 2023-02-19: 2.5 [IU]/h via INTRAVENOUS

## 2023-02-18 MED ORDER — ONDANSETRON HCL 4 MG/2ML IJ SOLN
4.0000 mg | Freq: Four times a day (QID) | INTRAMUSCULAR | Status: DC | PRN
  Administered 2023-02-18: 4 mg via INTRAVENOUS
  Filled 2023-02-18: qty 2

## 2023-02-18 MED ORDER — FENTANYL-BUPIVACAINE-NACL 0.5-0.125-0.9 MG/250ML-% EP SOLN
12.0000 mL/h | EPIDURAL | Status: DC | PRN
  Administered 2023-02-18: 12 mL/h via EPIDURAL
  Filled 2023-02-18 (×2): qty 250

## 2023-02-18 MED ORDER — OXYCODONE-ACETAMINOPHEN 5-325 MG PO TABS: 1.0000 | ORAL_TABLET | ORAL | Status: DC | PRN

## 2023-02-18 MED ORDER — LACTATED RINGERS IV SOLN: 500.0000 mL | Freq: Once | INTRAVENOUS | Status: DC

## 2023-02-18 MED ORDER — LIDOCAINE HCL (PF) 1 % IJ SOLN: 30.0000 mL | INTRAMUSCULAR | Status: DC | PRN

## 2023-02-18 MED ORDER — OXYCODONE-ACETAMINOPHEN 5-325 MG PO TABS: 2.0000 | ORAL_TABLET | ORAL | Status: DC | PRN

## 2023-02-18 MED ORDER — MISOPROSTOL 25 MCG QUARTER TABLET
25.0000 ug | ORAL_TABLET | Freq: Once | ORAL | Status: AC
  Administered 2023-02-18: 25 ug via ORAL
  Filled 2023-02-18: qty 1

## 2023-02-18 MED ORDER — OXYTOCIN-SODIUM CHLORIDE 30-0.9 UT/500ML-% IV SOLN
1.0000 m[IU]/min | INTRAVENOUS | Status: DC
  Administered 2023-02-18: 2 m[IU]/min via INTRAVENOUS
  Filled 2023-02-18: qty 500

## 2023-02-18 MED ORDER — LACTATED RINGERS IV SOLN
500.0000 mL | INTRAVENOUS | Status: DC | PRN
  Administered 2023-02-18: 500 mL via INTRAVENOUS

## 2023-02-18 MED ORDER — FLEET ENEMA 7-19 GM/118ML RE ENEM: 1.0000 | ENEMA | RECTAL | Status: DC | PRN

## 2023-02-18 MED ORDER — PHENYLEPHRINE 80 MCG/ML (10ML) SYRINGE FOR IV PUSH (FOR BLOOD PRESSURE SUPPORT)
80.0000 ug | PREFILLED_SYRINGE | INTRAVENOUS | Status: DC | PRN
  Administered 2023-02-18: 80 ug via INTRAVENOUS

## 2023-02-18 NOTE — Anesthesia Procedure Notes (Signed)
Epidural Patient location during procedure: OB Start time: 02/18/2023 12:47 PM End time: 02/18/2023 1:00 PM  Staffing Anesthesiologist: Bethena Midget, MD  Preanesthetic Checklist Completed: patient identified, IV checked, site marked, risks and benefits discussed, surgical consent, monitors and equipment checked, pre-op evaluation and timeout performed  Epidural Patient position: sitting Prep: DuraPrep and site prepped and draped Patient monitoring: continuous pulse ox and blood pressure Approach: midline Location: L3-L4 Injection technique: LOR air  Needle:  Needle type: Tuohy  Needle gauge: 17 G Needle length: 9 cm and 9 Needle insertion depth: 7 cm Catheter type: closed end flexible Catheter size: 19 Gauge Catheter at skin depth: 13 cm Test dose: negative  Assessment Events: blood not aspirated, no cerebrospinal fluid, injection not painful, no injection resistance, no paresthesia and negative IV test

## 2023-02-18 NOTE — H&P (Signed)
Paula Lang is a 22 y.o. female G1P0 [redacted]w[redacted]d presenting for IOL for gHTN  Patient seen in office yesterday 7/10 after having elevated BP's at home. In the office her BP's were 152/92 and repeat 130/96. This met criteria for gHTN as she had one previous visit at 35.5 with mild range BP at that time. Given that patient was 36.6 at yesterday's office visit, she had PIH labs drawn with plan for short term follow up. However, labs came back later that night showing elevated ALT value. Patient was called and advised to report to LD for IOL.   Pregnancy dated by LMP c/w 8w sono. Routine prenatal care at [facility]. Patient had routine prenatal labs that were WNL. Patient had a low risk NIPS and normal fetal anatomy scan. Normal 1hr GTT 82 and mild anemia Hgb 10.1 at third trimester screening. Most recent growth US performed at 34.5 showed vertex presentation with an EFW 7#3 (99%) HC 83% AC 99% with anterior placenta. GBS negative. PMH of ADHD and has been using Adderall PRN throughout the pregnancy.   Patient with partner Thayer Ohm for labor support. They are expecting a baby girl "Paula Lang"  OB History     Gravida  1   Para      Term      Preterm      AB      Living         SAB      IAB      Ectopic      Multiple      Live Births             Past Medical History:  Diagnosis Date   Medical history non-contributory    Past Surgical History:  Procedure Laterality Date   NO PAST SURGERIES     Family History: family history is not on file. Social History:  reports that she has never smoked. She has never used smokeless tobacco. She reports that she does not currently use drugs after having used the following drugs: Marijuana. She reports that she does not drink alcohol.     Maternal Diabetes: No Genetic Screening: Normal Maternal Ultrasounds/Referrals: Normal Fetal Ultrasounds or other Referrals:  None Maternal Substance Abuse:  No Significant Maternal Medications:   Meds include: Other:  Significant Maternal Lab Results:  Group B Strep negative Other Comments:  None  Review of Systems  All other systems reviewed and are negative.  Per HPI Maternal Exam:  Uterine Assessment: Contraction frequency is rare.  Abdomen: Patient reports no abdominal tenderness. Estimated fetal weight is 7#14.   Fetal presentation: vertex Introitus: Normal vulva.   Fetal Exam Fetal State Assessment: Category I - tracings are normal.   Physical Exam Vitals reviewed.  Constitutional:      Appearance: Normal appearance.  HENT:     Head: Normocephalic.  Cardiovascular:     Rate and Rhythm: Normal rate.  Pulmonary:     Effort: Pulmonary effort is normal.  Abdominal:     Tenderness: There is no abdominal tenderness.  Genitourinary:    General: Normal vulva.  Musculoskeletal:        General: Normal range of motion.     Cervical back: Normal range of motion.  Skin:    General: Skin is warm and dry.  Neurological:     General: No focal deficit present.     Mental Status: She is alert and oriented to person, place, and time.  Psychiatric:  Mood and Affect: Mood normal.        Behavior: Behavior normal.     Dilation: 1.5 Effacement (%): 30 Station: -3 Exam by:: Olivia P RN Blood pressure 112/67, pulse (!) 103, temperature 98.8 F (37.1 C), temperature source Oral, resp. rate 20, height 5\' 6"  (1.676 m), weight 97.9 kg, SpO2 100%.  Prenatal labs: ABO, Rh:  --/--/O POS (07/11 0124) Antibody: NEG (07/11 0124) Rubella: Immune (01/09 0000) RPR:   Non-reactive HBsAg: Negative (01/09 0000)  HIV: Non Reactive (07/11 0124)  GBS:   Negative   Chemistry Recent Labs  Lab 02/18/23 0124  NA 133*  K 3.9  CL 102  CO2 20*  GLUCOSE 88  BUN 6  CREATININE 0.73  CALCIUM 9.7  GFRNONAA >60  ANIONGAP 11    Recent Labs  Lab 02/18/23 0124  PROT 7.0  ALBUMIN 2.8*  AST 37  ALT 50*  ALKPHOS 123  BILITOT 0.4   Hematology Recent Labs  Lab  02/18/23 0124  WBC 11.3*  RBC 3.74*  HGB 10.7*  HCT 32.5*  MCV 86.9  MCH 28.6  MCHC 32.9  RDW 12.3  PLT 273   Cardiac EnzymesNo results for input(s): "TROPONINI" in the last 168 hours. No results for input(s): "TROPIPOC" in the last 168 hours.  BNPNo results for input(s): "BNP", "PROBNP" in the last 168 hours.  DDimer No results for input(s): "DDIMER" in the last 168 hours.   Assessment/Plan: Naomii Kreger is a 22 y.o. female G1P0 [redacted]w[redacted]d admitted for IOL for gHTN.   -Admission to LD -Routine admission labs -New dx of gHTN: mostly normal BP's here, admission PIH labs normal except ALT 50, no medications, will cont to monitor -IOL with cytotec PRN cervical ripening, consider Foley balloon -Cont EFM/Toco -Pt planning epidural when ready -GBS NEG -LGA EFW 7#3 at 34.5 -Routine intrapartum care -Anticipate SVD   Browning [place] A Delonte Musich 02/18/2023, 7:20 AM

## 2023-02-18 NOTE — Anesthesia Preprocedure Evaluation (Signed)
Anesthesia Evaluation  Patient identified by MRN, date of birth, ID band Patient awake    Reviewed: Allergy & Precautions, H&P , NPO status , Patient's Chart, lab work & pertinent test results, reviewed documented beta blocker date and time   Airway Mallampati: III  TM Distance: >3 FB Neck ROM: full    Dental no notable dental hx. (+) Dental Advisory Given   Pulmonary neg pulmonary ROS   Pulmonary exam normal breath sounds clear to auscultation       Cardiovascular hypertension, Pt. on medications Normal cardiovascular exam Rhythm:regular Rate:Normal     Neuro/Psych negative neurological ROS  negative psych ROS   GI/Hepatic negative GI ROS, Neg liver ROS,,,  Endo/Other  negative endocrine ROS    Renal/GU negative Renal ROS  negative genitourinary   Musculoskeletal   Abdominal   Peds  Hematology negative hematology ROS (+)   Anesthesia Other Findings   Reproductive/Obstetrics (+) Pregnancy                             Anesthesia Physical Anesthesia Plan  ASA: 3  Anesthesia Plan: Epidural   Post-op Pain Management: Minimal or no pain anticipated   Induction:   PONV Risk Score and Plan: 2 and Ondansetron  Airway Management Planned: Natural Airway  Additional Equipment:   Intra-op Plan:   Post-operative Plan:   Informed Consent: I have reviewed the patients History and Physical, chart, labs and discussed the procedure including the risks, benefits and alternatives for the proposed anesthesia with the patient or authorized representative who has indicated his/her understanding and acceptance.     Dental Advisory Given  Plan Discussed with: Anesthesiologist  Anesthesia Plan Comments: (Labs checked- platelets confirmed with RN in room. Fetal heart tracing, per RN, reported to be stable enough for sitting procedure. Discussed epidural, and patient consents to the procedure:   included risk of possible headache,backache, failed block, allergic reaction, and nerve injury. This patient was asked if she had any questions or concerns before the procedure started.)       Anesthesia Quick Evaluation

## 2023-02-18 NOTE — MAU Note (Signed)
..  Paula Lang is a 22 y.o. at [redacted]w[redacted]d here in MAU reporting: Was sent in for direct admit after her blood test results came back Headache and visual changes +FM. Denies vaginal bleeding or leaking of fluid or contractions.   Pain score: 4/10 Vitals:   02/18/23 0042  BP: (!) 141/90  Pulse: 97  Resp: 17  Temp: 97.9 F (36.6 C)  SpO2: 100%     FHT:144

## 2023-02-18 NOTE — Progress Notes (Signed)
S: Doing well, no complaints, pain well controlled with epidural  O: BP 118/76   Pulse (!) 109   Temp 98.8 F (37.1 C) (Oral)   Resp 19   Ht 5\' 6"  (1.676 m)   Wt 97.9 kg   SpO2 99%   BMI 34.85 kg/m    FHT:  FHR: 150 bpm, variability: moderate,  accelerations:  Present,  decelerations:  Absent UC:   regular, every 2-3 minutes, aqequate MVI SVE:   Dilation: 6 Effacement (%): 80 Station: -1 Exam by:: Dr. Ernestina Penna No change from 6:30p to exam above at 9p  A / P:  22 y.o.  OB History  Gravida Para Term Preterm AB Living  1 0 0 0 0 0  SAB IAB Ectopic Multiple Live Births  0 0 0 0 0   at [redacted]w[redacted]d Protracted active phase  Fetal Wellbeing:  Category I Pain Control:  Epidural  Anticipated MOD:   will give more time for change but concern for CPD, known LGA at 34.   [name] 02/18/2023, 10:06 PM

## 2023-02-18 NOTE — Progress Notes (Signed)
Labor Progress Note  S/O: Pt comfortable with epidural  Vitals:   02/18/23 1311 02/18/23 1317  BP: 131/83 127/77  Pulse: (!) 115 (!) 118  Resp: 17 16  Temp:    SpO2: 99% 99%    SVE: 5/60/-2   EFM: cat I baseline 140 bpm mod var +accels, -decels Toco: ctxs q 1-2 min  A/P: 22Y G1P0 @ 37.0 IOL gHTN  -IOL: s/p 2 doses of cytotec and foley balloon. Pt consented for AROM--performed for clear fluid, currently on Pitocin of 4mU, cont titration per protocol -Cont EFM/Toco -Epidural labor pain mgmt -gHTN: mild to normal range BP's, isolated elevated ALT otherwise labs WNL and asymptomatic, cont to monitor -routine intrapartum care -anticipate SVD  Paula Lang A Paula Lang 02/18/23 1:31 PM

## 2023-02-18 NOTE — Progress Notes (Signed)
Labor Progress Note  S/O: Pt feeling comfortable with epidural. Had some rectal pressure earlier but none now  Vitals:   02/18/23 1801 02/18/23 1831  BP: 121/82 132/87  Pulse: (!) 104 (!) 123  Resp: 16 17  Temp:    SpO2:      SVE: 6/80/-2 to -1  EFM: Cat I baseline 145 bpm mod var +accels, -decels Toco: ctxs q 1-3 min  A/P: 22Y G1P0 @ 37.0 IOL gHTN   -IOL: s/p 2 doses of cytotec and foley balloon. S/p AROM x5hr, currently on Pitocin of 14mU. IUPC placed to assess adequate ctxs, titrate Pitocin as needed -Cont EFM/IUPC: cat I -Epidural labor pain mgmt -gHTN: mild to normal range BP's, isolated elevated ALT otherwise labs WNL and asymptomatic, cont to monitor -routine intrapartum care -working towards SVD  Paula Lang A Paula Lang 02/18/23 6:45 PM

## 2023-02-18 NOTE — Progress Notes (Signed)
Labor Progress Note   S/O: Pt comfortable, denies any feeling of pain or discomfort. Partner Christopher at bedside providing support  Vitals:   02/18/23 0658 02/18/23 0943  BP: 112/67 124/87  Pulse: (!) 103 93  Resp:  18  Temp:  98.6 F (37 C)  SpO2:      SVE: 1-2/50/-3  EFM: cat I baseline 140 bpm mod var +accels, -decels, Toco: ctxs not tracing well when pt on side, but appear q 2-5 min when tracing  Paula/P: 22Y G1P0 @ 37.0 IOL gHTN  -IOL: s/p 2 doses of cytotec, last dose at 0700, consented for Foley balloon placement--placed without difficulties and well tolerated by patient, inflated with 60cc. Discussed consideration for Pitocin addition once it has been 4hrs from last cytotec and AROM once balloon expels -Cont EFM/Toco: Cat I -Pt may have IV pain meds/Epidural when requested -GBS NEG -gHTN: mild to normal BP's cont to monitor -Routine intrapartum care -Anticipate SVD  Paula Lang Paula Lang 02/18/23 10:05 AM

## 2023-02-19 ENCOUNTER — Encounter (HOSPITAL_COMMUNITY): Payer: Self-pay | Admitting: Obstetrics and Gynecology

## 2023-02-19 LAB — CBC
HCT: 31 % — ABNORMAL LOW (ref 36.0–46.0)
HCT: 29.6 % — ABNORMAL LOW (ref 36.0–46.0)
Hemoglobin: 10.3 g/dL — ABNORMAL LOW (ref 12.0–15.0)
Hemoglobin: 9.5 g/dL — ABNORMAL LOW (ref 12.0–15.0)
MCH: 29.4 pg (ref 26.0–34.0)
MCH: 28.5 pg (ref 26.0–34.0)
MCHC: 33.2 g/dL (ref 30.0–36.0)
MCHC: 32.1 g/dL (ref 30.0–36.0)
MCV: 88.6 fL (ref 80.0–100.0)
MCV: 88.9 fL (ref 80.0–100.0)
Platelets: 213 10*3/uL (ref 150–400)
Platelets: 217 10*3/uL (ref 150–400)
RBC: 3.5 MIL/uL — ABNORMAL LOW (ref 3.87–5.11)
RBC: 3.33 MIL/uL — ABNORMAL LOW (ref 3.87–5.11)
RDW: 12.3 % (ref 11.5–15.5)
RDW: 12.3 % (ref 11.5–15.5)
WBC: 16 10*3/uL — ABNORMAL HIGH (ref 4.0–10.5)
WBC: 15.8 10*3/uL — ABNORMAL HIGH (ref 4.0–10.5)
nRBC: 0 % (ref 0.0–0.2)
nRBC: 0 % (ref 0.0–0.2)

## 2023-02-19 MED ORDER — BENZOCAINE-MENTHOL 20-0.5 % EX AERO
1.0000 | INHALATION_SPRAY | CUTANEOUS | Status: DC | PRN
  Administered 2023-02-19: 1 via TOPICAL
  Filled 2023-02-19: qty 56

## 2023-02-19 MED ORDER — DIPHENHYDRAMINE HCL 25 MG PO CAPS: 25.0000 mg | ORAL_CAPSULE | Freq: Four times a day (QID) | ORAL | Status: DC | PRN

## 2023-02-19 MED ORDER — TETANUS-DIPHTH-ACELL PERTUSSIS 5-2.5-18.5 LF-MCG/0.5 IM SUSY: 0.5000 mL | PREFILLED_SYRINGE | Freq: Once | INTRAMUSCULAR | Status: DC

## 2023-02-19 MED ORDER — ACETAMINOPHEN 325 MG PO TABS
650.0000 mg | ORAL_TABLET | ORAL | Status: DC | PRN
  Administered 2023-02-19: 650 mg via ORAL
  Filled 2023-02-19: qty 2

## 2023-02-19 MED ORDER — NIFEDIPINE ER OSMOTIC RELEASE 30 MG PO TB24
30.0000 mg | ORAL_TABLET | Freq: Every day | ORAL | Status: DC
  Administered 2023-02-19 – 2023-02-20 (×2): 30 mg via ORAL
  Filled 2023-02-19 (×3): qty 1

## 2023-02-19 MED ORDER — POLYSACCHARIDE IRON COMPLEX 150 MG PO CAPS
150.0000 mg | ORAL_CAPSULE | Freq: Every day | ORAL | Status: DC
  Administered 2023-02-19 – 2023-02-21 (×3): 150 mg via ORAL
  Filled 2023-02-19 (×4): qty 1

## 2023-02-19 MED ORDER — DIBUCAINE (PERIANAL) 1 % EX OINT: 1.0000 | TOPICAL_OINTMENT | CUTANEOUS | Status: DC | PRN

## 2023-02-19 MED ORDER — IBUPROFEN 600 MG PO TABS
600.0000 mg | ORAL_TABLET | Freq: Four times a day (QID) | ORAL | Status: DC
  Administered 2023-02-19 – 2023-02-21 (×9): 600 mg via ORAL
  Filled 2023-02-19 (×10): qty 1

## 2023-02-19 MED ORDER — OXYCODONE HCL 5 MG PO TABS: 5.0000 mg | ORAL_TABLET | ORAL | Status: DC | PRN

## 2023-02-19 MED ORDER — ONDANSETRON HCL 4 MG/2ML IJ SOLN: 4.0000 mg | INTRAMUSCULAR | Status: DC | PRN

## 2023-02-19 MED ORDER — ONDANSETRON HCL 4 MG PO TABS: 4.0000 mg | ORAL_TABLET | ORAL | Status: DC | PRN

## 2023-02-19 MED ORDER — MAGNESIUM OXIDE -MG SUPPLEMENT 400 (240 MG) MG PO TABS
400.0000 mg | ORAL_TABLET | Freq: Two times a day (BID) | ORAL | Status: DC
  Administered 2023-02-19 – 2023-02-21 (×5): 400 mg via ORAL
  Filled 2023-02-19 (×6): qty 1

## 2023-02-19 MED ORDER — SENNOSIDES-DOCUSATE SODIUM 8.6-50 MG PO TABS
2.0000 | ORAL_TABLET | ORAL | Status: DC
  Administered 2023-02-19 – 2023-02-20 (×2): 2 via ORAL
  Filled 2023-02-19 (×2): qty 2

## 2023-02-19 MED ORDER — OXYCODONE HCL 5 MG PO TABS: 10.0000 mg | ORAL_TABLET | ORAL | Status: DC | PRN

## 2023-02-19 MED ORDER — PRENATAL MULTIVITAMIN CH
1.0000 | ORAL_TABLET | Freq: Every day | ORAL | Status: DC
  Administered 2023-02-19 – 2023-02-21 (×3): 1 via ORAL
  Filled 2023-02-19 (×4): qty 1

## 2023-02-19 MED ORDER — SIMETHICONE 80 MG PO CHEW: 80.0000 mg | CHEWABLE_TABLET | ORAL | Status: DC | PRN

## 2023-02-19 MED ORDER — COCONUT OIL OIL: 1.0000 | TOPICAL_OIL | Status: DC | PRN

## 2023-02-19 MED ORDER — WITCH HAZEL-GLYCERIN EX PADS: 1.0000 | MEDICATED_PAD | CUTANEOUS | Status: DC | PRN

## 2023-02-19 MED ORDER — ZOLPIDEM TARTRATE 5 MG PO TABS: 5.0000 mg | ORAL_TABLET | Freq: Every evening | ORAL | Status: DC | PRN

## 2023-02-19 NOTE — Clinical Social Work Maternal (Addendum)
CLINICAL SOCIAL WORK MATERNAL/CHILD NOTE  Patient Details  Name: Paula Lang MRN: [id number] Date of Birth: May 07, 2001  Date:  02/19/2023  Clinical Social Worker Initiating Note:  Enos Fling Date/Time: Initiated:  02/19/23/1253     Child's Name:  Paula Lang   Biological Parents:  Mother, Father Paula Lang ([birth year] 2000) and Paula Lang (11-12-1998))   Need for Interpreter:  None   Reason for Referral:  Current Substance Use/Substance Use During Pregnancy     Address:  [address] [place] [address]    Phone number:  [phone] (home)     Additional phone number:   Household Members/Support Persons (HM/SP):   Household Member/Support Person 1   HM/SP Name Relationship DOB or Age  HM/SP -1 Paula Lang FOB 11-12-1998  HM/SP -2        HM/SP -3        HM/SP -4        HM/SP -5        HM/SP -6        HM/SP -7        HM/SP -8          Natural Supports (not living in the home):  Immediate Family, Spouse/significant other, Extended Family   Professional Supports: None   Employment: Unemployed   Type of Work:     Education:  Counsellor arranged:    Surveyor, quantity Resources:  Medicaid   Other Resources:      Cultural/Religious Considerations Which May Impact Care:    Strengths:  Ability to meet basic needs  , Merchandiser, retail, Home prepared for child  , Understanding of illness   Psychotropic Medications:         Pediatrician:    [employer]ed forces operational officer area  Pediatrician List:   [facility] (Peds)  [place]    [place]      Pediatrician Fax Number:    Risk Factors/Current Problems:  Substance Use     Cognitive State:  Alert  , Able to Concentrate  , Linear Thinking  , Insightful  , Goal Oriented     Mood/Affect:  Interested  , Comfortable  , Calm  , Relaxed     CSW Assessment: CSW received a consult for THC during pregnancy  and met MOB at bedside to complete a full psychosocial assessment. CSW entered the room, introduced herself and acknowledged that FOB was present. MOB gave CSW verbal permission to speak about anything while FOB was present. MOB was polite, easy to engage, receptive to meeting with CSW, and appeared forthcoming.   CSW collected MOB's demographic information and inquired her about mental health history. MOB reported being diagnosed with ADHD and GAD in 2022, due to being an overall anxious person. MOB reported being prescribed Adderall 20mg , that was taken during her pregnancy; and being prescribed Lexapro in the past for symptoms. MOB reported currently feeling "good" and bonded well with the infant. CSW provided education regarding the baby blues period vs. perinatal mood disorders, discussed treatment and gave resources for mental health follow up if concerns arise.  CSW recommends self-evaluation during the postpartum time period using the New Mom Checklist from Postpartum Progress and encouraged MOB to contact a medical professional if symptoms are noted at any time. CSW assessed for safety with MOB SI and HI; MOB denied all. CSW did not assess for DV; FOB was present.  CSW  assessed for resources needs with MOB. MOB denied receiving support from [facility] and foodstamps. MOB reported having all essential items for infant including a carseat, bassinet and crib for safe sleeping. CSW provided review of Sudden Infant Death Syndrome (SIDS) precautions.  CSW informed MOB due the use of THC prior to pregnancy, the hospital will perform a UDS and CDS on the infant. If any screenings return with positive results a report to CPS will be made; MOB was understanding. MOB reported her last THC use was in November 2023 and decided to no longer use due to risk of the infant. MOB reported prior to pregnancy she would use once a day at night and denied the use of any other illicit drugs.  Infant's urine results were negative  for all substances, CSW will monitor cord.   Update: CSW made a CPS report with [place] due to cord results positive for Amphetamines.   CSW Plan/Description:  No Further Intervention Required/No Barriers to Discharge, Sudden Infant Death Syndrome (SIDS) Education, Perinatal Mood and Anxiety Disorder (PMADs) Education, Hospital Drug Screen Policy Information, Other Information/Referral to [facility], CSW Will Continue to Monitor Umbilical Cord Tissue Drug Screen Results and Make Report if Jena Gauss 02/19/2023, 12:55 PM

## 2023-02-19 NOTE — Anesthesia Postprocedure Evaluation (Signed)
Anesthesia Post Note  Patient: [employer]  Procedure(s) Performed: AN AD HOC LABOR EPIDURAL     Patient location during evaluation: Mother Baby Anesthesia Type: Epidural Level of consciousness: awake and alert Pain management: pain level controlled Vital Signs Assessment: post-procedure vital signs reviewed and stable Respiratory status: spontaneous breathing, nonlabored ventilation and respiratory function stable Cardiovascular status: stable Postop Assessment: no headache, no backache, epidural receding, no apparent nausea or vomiting, patient able to bend at knees, able to ambulate and adequate PO intake Anesthetic complications: no   No notable events documented.  Last Vitals:  Vitals:   02/19/23 0646 02/19/23 0758  BP: 134/84 (!) 140/90  Pulse: 90 85  Resp:  20  Temp:  36.5 C  SpO2:  99%    Last Pain:  Vitals:   02/19/23 0758  TempSrc: Oral  PainSc: 3    Pain Goal:                   Laban Emperor

## 2023-02-19 NOTE — Lactation Note (Signed)
This note was copied from a baby's chart. Lactation Consultation Note  Patient Name: Girl Shanai Vessell [id number] Date: 02/19/2023 Age:23 hours   LC attempted to visit with the birth parent but she and the infant were asleep. LC asked the supporting parent to have the birth parent call for lactation when she wakes up. Lactation will follow up later.    Lizabeth Fellner P Jordon Kristiansen 02/19/2023, 12:42 PM

## 2023-02-19 NOTE — Progress Notes (Signed)
Postop day 0  Patient has been up since overnight delivery with multiple rotating gas.  Patient reported single episode of blurred vision to the nurse.  Currently denies headache or blurred vision.  Patient feels well  Physical exam: Vitals:   02/19/23 0339 02/19/23 0459 02/19/23 0646 02/19/23 0758  BP: 130/87 (!) 149/83 134/84 (!) 140/90  Pulse: 98 (!) 120 90 85  Resp: 18 19  20   Temp: 98.5 F (36.9 C) 99.3 F (37.4 C)  97.7 F (36.5 C)  TempSrc: Oral   Oral  SpO2: 97% 100%  99%  Weight:      Height:       General: Well-appearing no distress Cardiovascular: Regular rate and rhythm Pulmonary: Clear to auscultation bilaterally Lower extremities: Trace edema  Assessment and plan: Postpartum day 0 after induction for gestational hypertension.  Patient is not on any blood pressure medications.  Elevated blood pressure this morning likely due to lack of rest and excessive stimulation from multiple gas to the room.  Will reassess BPs after patient has had a chance to rest from delivery.  Low threshold to start antihypertensives given young age and diagnosis of gestational hypertension.  No clinical symptoms of preeclampsia. -Single elevated temperature just after delivery.  None since. continue to watch for signs or symptoms of CORHIO but patient has not been started on antibiotics Anemia.  Start iron and magnesium  [name] 02/19/2023 8:46 AM

## 2023-02-19 NOTE — Lactation Note (Signed)
This note was copied from a baby's chart. Lactation Consultation Note  Patient Name: Paula Lang [id number] Date: 02/19/2023 Age:23 hours Reason for consult: Initial assessment;Primapara;1st time breastfeeding;Early term 37-38.6wks;Infant weight loss;Breastfeeding assistance  The infant was at 63 hours old.  Per the birth parent the infant latched after birth but she has not latched since.  The birth parent stated that the infant swallowed amniotic fluid and had two episodes of emesis.  LC encouraged the birth parent to keep trying the infant at the breast and hand express if she does not latch.  The birth parent attempted to latch the infant and she did was not interested in latching.  The birth parent hand expressed 4mL into a spoon.  The infant was not interested in eating.  LC encouraged the birth parent to try to latch again in about an hour.  If the infant does not latch the birth parent will attempt to feed the infant the expressed milk via a syringe.  The birth parent has 70mL of expressed colostrum in the fridge that she will use if necessary.  The birth parent has a hands-free pump with her.  LC reviewed milk storage guidelines, feeding cues, pumping, and paced bottle feeding.   Infant Feeding Plan:  Breastfeed 8+ times in 24 hours according to feeding cues.  If the infant does not latch she should hand express and feed the infant the expressed milk via a syringe of bottle.  If the infant is still having trouble latching after 24 hours the birth parent should be set up with the DEBP and use her expressed milk.   Call RN/LC for assistance with breastfeeding.    Maternal Data Has patient been taught Hand Expression?: Yes Does the patient have breastfeeding experience prior to this delivery?: No  Feeding Mother's Current Feeding Choice: Breast Milk  Interventions Interventions: Hand express;Adjust position;Support pillows;Education;LC Services  brochure  Discharge Pump: Hands Free;Personal  Consult Status Consult Status: Follow-up Date: 02/20/23 Follow-up type: In-patient   Delene Loll 02/19/2023, 2:59 PM

## 2023-02-20 DIAGNOSIS — O9902 Anemia complicating childbirth: Secondary | ICD-10-CM | POA: Diagnosis not present

## 2023-02-20 NOTE — Lactation Note (Signed)
This note was copied from a baby's chart. Lactation Consultation Note  Patient Name: Paula Lang [id number] Date: 02/20/2023 Age:23 hours Reason for consult: Follow-up assessment;Primapara;1st time breastfeeding;Early term 37-38.6wks  LC in to room for follow up. Per parents, infant will be going soon to NICU due to retractions. Infant is latched upon arrival. Parent hand expresses and drops easily noted. LC reviewed using pillows for support and comfort. Demonstrated chin tug to improve latch. Talked about breastfeeding support at the NICU and encouraged to contact [facility] for needs or questions.   Plan:   1-Feeding on demand.  2-Encouraged birthing parent hydration, nutrition and rest.  Contact LC as needed for feeds/support/concerns/questions. All questions answered at this time.   Maternal Data Has patient been taught Hand Expression?: Yes Does the patient have breastfeeding experience prior to this delivery?: No  Feeding Mother's Current Feeding Choice: Breast Milk  LATCH Score Latch: Grasps breast easily, tongue down, lips flanged, rhythmical sucking.  Audible Swallowing: Spontaneous and intermittent  Type of Nipple: Everted at rest and after stimulation  Comfort (Breast/Nipple): Soft / non-tender  Hold (Positioning): Assistance needed to correctly position infant at breast and maintain latch.  LATCH Score: 9  Interventions Interventions: Assisted with latch;Skin to skin;Expressed milk;Education;Support pillows;Adjust position  Consult Status Consult Status: Follow-up Date: 02/21/23 Follow-up type: In-patient    Twylia Oka A Higuera Ancidey 02/20/2023, 9:57 AM

## 2023-02-20 NOTE — Progress Notes (Signed)
       PPD # 1 S/P NSVD  Live born female  Birth Weight: 8 lb 6.4 oz (3810 g) APGAR: 8, 9  Newborn Delivery   Birth date/time: 02/19/2023 01:18:23 Delivery type: Vaginal, Spontaneous    Baby name: [name] Delivering provider: Noland Fordyce Episiotomy:None  Lacerations:None   Feeding: breast  Pain control at delivery: Epidural  S:  Reports feeling well, Denies HA/NV/RUQ pain/visual changes.               Tolerating po/ No nausea or vomiting             Bleeding is light             Pain controlled with acetaminophen and ibuprofen (OTC)             Up ad lib / ambulatory / voiding without difficulties   O:  A & O x 3, in no apparent distress              VS:  Vitals:   02/19/23 1204 02/19/23 1533 02/19/23 2100 02/20/23 0537  BP: (!) 133/90 127/81 (!) 131/94 126/72  Pulse: 96 82 87 95  Resp: 20 20 18 18   Temp: 97.7 F (36.5 C) 98.3 F (36.8 C) 98.3 F (36.8 C) 98.6 F (37 C)  TempSrc: Oral Oral Oral Oral  SpO2:   99% 100%  Weight:      Height:        LABS:  Recent Labs    02/19/23 0222 02/19/23 0639  WBC 16.0* 15.8*  HGB 10.3* 9.5*  HCT 31.0* 29.6*  PLT 213 217    Blood type: --/--/O POS (07/11 0124)  Rubella: Immune (01/09 0000)   I&O: I/O last 3 completed shifts: In: 0  Out: 2800 [Urine:2700; Blood:100]          No intake/output data recorded.   Gen: AAO x 3, NAD  Abdomen: soft, non-tender, non-distended             Fundus: firm, non-tender, U-1  Perineum: intact, no edema  Lochia: small  Extremities: no edema, no calf pain or tenderness    A/P: PPD # 1 22 y.o., G1P1001   Principal Problem:   Gestational hypertension  - asymptomatic, BP labile to mild range  - continue Procardia 30XL daily, continue obs.  Active Problems:   Postpartum care following vaginal delivery 7/12   SVD (spontaneous vaginal delivery)   Maternal anemia, with delivery - ABL  - started oral Fe and Mag Ox  Doing well - stable status  Routine post partum  orders  Anticipate discharge tomorrow    Neta Mends, MSN, CNM 02/20/2023, 8:48 AM

## 2023-02-20 NOTE — Progress Notes (Signed)
RN notified Dr. Amado Nash of patient's increased blood pressures of 128/97 and 131/90.  RN also explained that patient got her procardia late due to infant being transferred to NICU and her being in NICU most of the day.  She stated we would continue to monitor and gave new parameters to call if BP was 160/110 or greater.  Paula Lang, [place]

## 2023-02-20 NOTE — Lactation Note (Signed)
This note was copied from a baby's chart.  NICU Lactation Consultation Note  Patient Name: Paula Lang [id number] Date: 02/20/2023 Age:23 hours  Reason for consult: Follow-up assessment; Primapara; 1st time breastfeeding; NICU baby; Early term 37-38.6wks; Other (Comment) (gHTN)  SUBJECTIVE Visited with family of 64 hours old ET NICU female; Paula Lang is a P1 and reports she's been taking baby to breast until NICU admission. Baby has a line on her head; she might take a break from putting her to breast until IV is out and focus on pumping, let parents know that this LC will be available to assist with latching (or pumping) either way. Set up a DEBP and provided a pumping top is size "L" for hands on pumping, she's already getting enough colostrum to collect, praised her for her efforts. Assisted with washing pump parts once she was done pumping. Reviewed pumping schedule, pump settings, lactogenesis II, infant feeding and anticipatory guidelines.  OBJECTIVE Infant data: Mother's Current Feeding Choice: Breast Milk  Maternal data: G1P1001 Vaginal, Spontaneous Has patient been taught Hand Expression?: Yes Hand Expression Comments: Good technique and easily expressed colostrum Significant Breast History:: (+) breast changes during the pregnancy, started leaking at 19-20 weeks and hand expressing at 36 weeks Current breast feeding challenges:: NICU admission Does the patient have breastfeeding experience prior to this delivery?: No Pumping frequency: initiated pumping at 35 hours post-partum due to NICU admission Pumped volume: 12 mL Flange Size: 21 (Needs smaller, but currently out of stock) Risk factor for low milk supply:: primipara, infant separation  Pump: Hands Free, Personal (Medela Swing double Maxi DEBP and wearable pump)  ASSESSMENT Infant: LATCH Documentation Latch: 2 Audible Swallowing: 2 Type of Nipple: 2 Comfort (Breast/Nipple): 2 Hold (Positioning): 1 LATCH  Score: 9  Feeding Status: Ad lib  Maternal: Milk volume: Normal  INTERVENTIONS/PLAN Interventions: Interventions: Breast feeding basics reviewed; DEBP; Education Tools: Pump; Flanges; Coconut oil; Hands-free pumping top (Size L "Gray") Pump Education: Setup, frequency, and cleaning; Milk Storage  Plan: Encouraged pumping every 3 hours, ideally 8 pumping sessions/24 hours She'll continue taking baby to breast on feeding cues and will call for assistance PRN Parents might try a bottle @ 19 ml/feeding per provider's order  FOB present and very supportive. All questions and concerns answered, family to contact [facility] services PRN.  Consult Status: NICU follow-up NICU Follow-up type: Maternal D/C visit   Paula Lang 02/20/2023, 1:12 PM

## 2023-02-21 MED ORDER — POLYSACCHARIDE IRON COMPLEX 150 MG PO CAPS: 150.0000 mg | ORAL_CAPSULE | ORAL | Status: AC

## 2023-02-21 MED ORDER — BENZOCAINE-MENTHOL 20-0.5 % EX AERO: 1.0000 | INHALATION_SPRAY | CUTANEOUS | Status: AC | PRN

## 2023-02-21 MED ORDER — MAGNESIUM OXIDE -MG SUPPLEMENT 400 (240 MG) MG PO TABS: 400.0000 mg | ORAL_TABLET | Freq: Every day | ORAL | Status: AC

## 2023-02-21 MED ORDER — COCONUT OIL OIL: 1.0000 | TOPICAL_OIL | Status: AC | PRN

## 2023-02-21 MED ORDER — NIFEDIPINE ER OSMOTIC RELEASE 30 MG PO TB24
30.0000 mg | ORAL_TABLET | Freq: Two times a day (BID) | ORAL | Status: DC
  Administered 2023-02-21: 30 mg via ORAL
  Filled 2023-02-21: qty 1

## 2023-02-21 MED ORDER — IBUPROFEN 600 MG PO TABS: 600.0000 mg | ORAL_TABLET | Freq: Four times a day (QID) | ORAL | 0 refills | Status: AC

## 2023-02-21 MED ORDER — NIFEDIPINE ER 30 MG PO TB24: 30.0000 mg | ORAL_TABLET | Freq: Two times a day (BID) | ORAL | 0 refills | Status: AC

## 2023-02-21 MED ORDER — ACETAMINOPHEN 325 MG PO TABS: 650.0000 mg | ORAL_TABLET | ORAL | Status: AC | PRN

## 2023-02-21 NOTE — Progress Notes (Addendum)
CSW acknowledges social work consult placed due to [facility] scoring a 10 on [place] questionnaire. A clinical assessment was completed with MOB on 02/19/2023 where mental health concerns were addressed and resources were provided. No barriers to MOB's discharge.   Signed,  Norberto Sorenson, MSW, LCSWA, LCASA 02/21/2023 9:47 AM

## 2023-02-21 NOTE — Discharge Instructions (Signed)
Lactation outpatient support - home visit   Jessica Bowers, IBCLC (lactation consultant)  & Birth Doula  Phone (text or call): [phone] Email: [email or web address]   Linda Coppola RN, MHA, IBCLC at [facility]: Lactation Consultant  [email or web address] Mail: [email or web address] Tel: [phone]   Additional breastfeeding resources:  [facility] https://ibconline.ca/information-sheets/  [employer] [place]  [email or web address]   Other Resources:  Chiropractic specialist   Dr. Leanna Hastings [email or web address]   Craniosacral therapy for baby  Erin Balkind  [email or web address]  Pediatric Dentist (for tongue ties)  Dr. Kate Lambert Spangler, [facility]  Phone: [phone]  [address] [place] [address] [email or web address]  

## 2023-02-21 NOTE — Lactation Note (Signed)
This note was copied from a baby's chart.  NICU Lactation Consultation Note  Patient Name: Paula Lang [id number] Date: 02/21/2023 Age:23 hours  Reason for consult: Follow-up assessment; 1st time breastfeeding; Primapara; NICU baby; Early term 7-38.6wks; Other (Comment); Maternal discharge (gHTN)  SUBJECTIVE Visited with family of 35 hours old ETI NICU female; Paula Lang is a P1 and reports she's been pumping consistently around baby's feeding schedule, her milk came in this morning, praised her for her efforts. She's getting discharged today. Reviewed discharge education and the importance of continuing pumping consistently for the prevention of engorgement and to protect her supply. Revised pumping schedule, pump settings, lactogenesis III and anticipatory guidelines.  OBJECTIVE Infant data: Mother's Current Feeding Choice: Breast Milk  Infant feeding assessment Scale for Readiness: 3 (tachypnic) Scale for Quality: -- (tachypneic)   Maternal data: G1P1001 Vaginal, Spontaneous Has patient been taught Hand Expression?: Yes Hand Expression Comments: Good technique and easily expressed colostrum Significant Breast History:: (+) breast changes during the pregnancy, started leaking at 19-20 weeks and hand expressing at 36 weeks Current breast feeding challenges:: NICU admission Does the patient have breastfeeding experience prior to this delivery?: No Pumping frequency: 7 times/24 hours Pumped volume: 45 mL Flange Size: 21 (needs smaller but currently out of stock) Risk factor for low milk supply:: primipara, infant separation  Pump: Hands Free, Personal (Medela Swing double Maxi DEBP and wearable pump)  ASSESSMENT Infant: Feeding status: Scheduled 8-11-2-5  Maternal: Milk volume: Normal  INTERVENTIONS/PLAN Interventions: Interventions: Breast feeding basics reviewed; DEBP; Education; Coconut oil Discharge Education: Engorgement and breast care Tools: Pump; Flanges;  Hands-free pumping top; Coconut oil (Size "L" Gray) Pump Education: Setup, frequency, and cleaning; Milk Storage  Plan: Encouraged pumping every 3 hours, ideally 8 pumping sessions/24 hours She'll take all pump parts to baby's room after her discharge She'll switch her pump settings from initiation to maintenance mode She'll call for assistance when baby is ready to go to breast, breastfeeding deferred at this time due to tachypnea   No other support person at this time, FOB in baby's room. All questions and concerns answered, family to contact [facility] services PRN.  Consult Status: NICU follow-up NICU Follow-up type: Verify absence of engorgement; Weekly NICU follow up   Evanna Washinton S Philis Nettle 02/21/2023, 2:35 PM

## 2023-02-21 NOTE — Discharge Summary (Signed)
OB Discharge Summary  Patient Name: Paula Lang DOB: 24-Aug-2000 MRN: [id number]  Date of admission: 02/18/2023 Delivering provider: Noland Fordyce  Admitting diagnosis: Hypertension affecting pregnancy in third trimester [O16.3] Intrauterine pregnancy: [redacted]w[redacted]d     Secondary diagnosis: Patient Active Problem List   Diagnosis Date Noted   Postpartum care following vaginal delivery 7/12 02/20/2023   SVD (spontaneous vaginal delivery) 02/20/2023   Maternal anemia, with delivery - ABL 02/20/2023   Gestational hypertension 02/18/2023   Additional problems:none   Date of discharge: 02/21/2023   Discharge diagnosis: Principal Problem:   Postpartum care following vaginal delivery 7/12 Active Problems:   Gestational hypertension   SVD (spontaneous vaginal delivery)   Maternal anemia, with delivery - ABL                                                              Post partum procedures: none  Augmentation: AROM, Pitocin, Cytotec, and IP Foley Pain control: Epidural Laceration:None Episiotomy:None Complications: None  Hospital course:  Induction of Labor With Vaginal Delivery   22 y.o. yo G1P1001 at [redacted]w[redacted]d was admitted to the hospital 02/18/2023 for induction of labor.  Indication for induction: Gestational hypertension.  Patient had an labor course complicated by none. Membrane Rupture Time/Date: 1:23 PM,02/18/2023  Delivery Method:Vaginal, Spontaneous Episiotomy: None Lacerations:  None Details of delivery can be found in separate delivery note.  Patient had a postpartum course complicated by continued elevated blood pressures to mild range, labs were benign with only mild elevation in ALT 50 on admit and no preeclampsia neural symptoms. She was started on Procardia 30 XL daily after delivery and increased to BID on PPD2 for occasional mild elevated BP. Patient is discharged home 02/21/23. Close follow up in office in 5 days. Patient instructed to check BP at home daily, call  office if any > 140/90. Preeclampsia precautions reviewed with patient.   Newborn Data: Birth date:02/19/2023 Birth time:1:18 AM Gender:Female Living status:Living Apgars:8 ,9  Weight:3810 g  Physical exam  Vitals:   02/20/23 0537 02/20/23 1334 02/20/23 1549 02/21/23 0334  BP: 126/72 (!) 128/97 (!) 131/90 132/86  Pulse: 95 93 73 85  Resp: 18 18 19 18   Temp: 98.6 F (37 C) 97.9 F (36.6 C) 98.3 F (36.8 C)   TempSrc: Oral Oral Oral   SpO2: 100% 100% 100% 100%  Weight:      Height:       General: alert, cooperative, and no distress Lochia: appropriate Uterine Fundus: firm Incision: N/A Perineum: repair intact, no edema DVT Evaluation: No cords or calf tenderness. No significant calf/ankle edema. Labs: Lab Results  Component Value Date   WBC 15.8 (H) 02/19/2023   HGB 9.5 (L) 02/19/2023   HCT 29.6 (L) 02/19/2023   MCV 88.9 02/19/2023   PLT 217 02/19/2023      Latest Ref Rng & Units 02/18/2023    1:24 AM  CMP  Glucose 70 - 99 mg/dL 88   BUN 6 - 20 mg/dL 6   Creatinine 2.59 - 5.63 mg/dL 8.75   Sodium 643 - 329 mmol/L 133   Potassium 3.5 - 5.1 mmol/L 3.9   Chloride 98 - 111 mmol/L 102   CO2 22 - 32 mmol/L 20   Calcium 8.9 - 10.3 mg/dL 9.7   Total  Protein 6.5 - 8.1 g/dL 7.0   Total Bilirubin 0.3 - 1.2 mg/dL 0.4   Alkaline Phos 38 - 126 U/L 123   AST 15 - 41 U/L 37   ALT 0 - 44 U/L 50       02/21/2023    9:00 AM 02/19/2023    3:39 AM  Edinburgh Postnatal Depression Scale Screening Tool  I have been able to laugh and see the funny side of things. 0 --  I have looked forward with enjoyment to things. 0   I have blamed myself unnecessarily when things went wrong. 2   I have been anxious or worried for no good reason. 2   I have felt scared or panicky for no good reason. 2   Things have been getting on top of me. 1   I have been so unhappy that I have had difficulty sleeping. 1   I have felt sad or miserable. 1   I have been so unhappy that I have been crying. 1    The thought of harming myself has occurred to me. 0   Edinburgh Postnatal Depression Scale Total 10    Discharge instruction:  per After Visit Summary,  [facility] booklet and  "Understanding Mother & Baby Care" hospital booklet After Visit Meds:  Allergies as of 02/21/2023   No Known Allergies      Medication List     STOP taking these medications    metroNIDAZOLE 500 MG tablet Commonly known as: Flagyl   nitrofurantoin (macrocrystal-monohydrate) 100 MG capsule Commonly known as: MACROBID       TAKE these medications    acetaminophen 325 MG tablet Commonly known as: Tylenol Take 2 tablets (650 mg total) by mouth every 4 (four) hours as needed (for pain scale < 4).   benzocaine-Menthol 20-0.5 % Aero Commonly known as: DERMOPLAST Apply 1 Application topically as needed for irritation (perineal discomfort).   coconut oil Oil Apply 1 Application topically as needed.   ibuprofen 600 MG tablet Commonly known as: ADVIL Take 1 tablet (600 mg total) by mouth every 6 (six) hours.   iron polysaccharides 150 MG capsule Commonly known as: Ferrex 150 Take 1 capsule (150 mg total) by mouth every other day.   magnesium oxide 400 (240 Mg) MG tablet Commonly known as: MAG-OX Take 1 tablet (400 mg total) by mouth daily. For prevention of constipation.   NIFEdipine 30 MG 24 hr tablet Commonly known as: ADALAT CC Take 1 tablet (30 mg total) by mouth 2 (two) times daily.   prenatal multivitamin Tabs tablet Take 1 tablet by mouth daily at 12 noon.               Discharge Care Instructions  (From admission, onward)           Start     Ordered   02/21/23 0000  Discharge wound care:       Comments: Sitz baths 2 times /day with warm water x 1 week. May add herbals: 1 ounce dried comfrey leaf* 1 ounce calendula flowers 1 ounce lavender flowers  Supplies can be found online at [employer] sources at [employer], [employer]  1/2 ounce dried uva  ursi leaves 1/2 ounce witch hazel blossoms (if you can find them) 1/2 ounce dried sage leaf 1/2 cup sea salt Directions: Bring 2 quarts of water to a boil. Turn off heat, and place 1 ounce (approximately 1 large handful) of the above mixed herbs (not  the salt) into the pot. Steep, covered, for 30 minutes.  Strain the liquid well with a fine mesh strainer, and discard the herb material. Add 2 quarts of liquid to the tub, along with the 1/2 cup of salt. This medicinal liquid can also be made into compresses and peri-rinses.   02/21/23 0958           Diet: iron rich diet Activity: Advance as tolerated. Pelvic rest for 6 weeks.  Postpartum contraception: TBA in office Newborn Data: Live born female  Birth Weight: 8 lb 6.4 oz (3810 g) APGAR: 8, 9  Newborn Delivery   Birth date/time: 02/19/2023 01:18:23 Delivery type: Vaginal, Spontaneous     named [name]  NICU admit yesterday for TTN Baby Feeding: Breast Disposition:NICU Delivery Report: Review the Delivery Report for details.   Follow up:  Follow-up Information     Obgyn, [facility]. Schedule an appointment as soon as possible for a visit on 02/25/2023.   Why: Blood pressure check Contact information: [address] [place] [address] [phone]                Signed: Cipriano Mile, MSN 02/21/2023, 9:59 AM

## 2023-02-22 ENCOUNTER — Ambulatory Visit (HOSPITAL_COMMUNITY): Payer: Self-pay

## 2023-02-22 NOTE — Lactation Note (Signed)
This note was copied from a baby's chart.  NICU Lactation Consultation Note  Patient Name: Paula Lang [id number] Date: 02/22/2023 Age:23 days  Reason for consult: Follow-up assessment; Primapara; 1st time breastfeeding; Mother's request; NICU baby; Early term 27-38.6wks; Other (Comment) (gHTN)  SUBJECTIVE Visited with family of 28 hours old ETI NICU female; Ms. Salinas called out for lactation because she had some questions about the latch. Baby "[name]" has been going to breast consistently and had already finished nursing when entered the room and falling asleep, provided some tips for latching and positioning while baby was still on her lap "at the breast"; baby not eager to nurse at this time since she got the hiccups. Asked family to call for latch assistance when needed; left couplet doing STS care. Ms. Guthridge reports pumping is going well and her supply has exponentially increased now that she's pumping every 3 hours day and night, praised her for her efforts. Parents were wondering "how much" or "how long" does she need to nurse since she's now on ad lib status, they removed her IV lines this morning and also the NG feeding tube. Revised normalcy patterns for an ETI and encouraged to continue taking baby to breast on feeding cues, explained that in the IDF algorithm they need to nurse at least for 15 minutes to forgo any gavage feeding. Ms. Vittitow plan is to primarily breastfeed but they are open to try bottles as a back up if needed. Discharge planning already underway, possibly tomorrow.   OBJECTIVE Infant data: Mother's Current Feeding Choice: Breast Milk  Infant feeding assessment Scale for Readiness: 1   Maternal data: G1P1001 Vaginal, Spontaneous Pumping frequency: 8 times/24 hours Pumped volume: 120 mL Flange Size: Other (comment) (inserts # [id number])  Pump: Hands Free, Personal (Medela Swing double Maxi DEBP and wearable pump)  ASSESSMENT Infant: LATCH  Documentation Latch: 2 Audible Swallowing: 2 Type of Nipple: 2 Comfort (Breast/Nipple): 2 Hold (Positioning): 2 LATCH Score: 10  Feeding Status: Ad lib  Maternal: Milk volume: Abundant  INTERVENTIONS/PLAN Interventions: Interventions: Breast feeding basics reviewed; DEBP; Education; Coconut oil; Infant Driven Feeding Algorithm education; Skin to skin Discharge Education: Engorgement and breast care Tools: Pump; Flanges; Coconut oil; Hands-free pumping top (Size L "Gray") Pump Education: Setup, frequency, and cleaning; Milk Storage  Plan: Encouraged pumping on maintenance mode every 3 hours, ideally 8 pumping sessions/24 hours She'll continue taking baby to breast STS on feeding cues + 8 times/24 hours She'll check with provider about doing bottles as a back up before going home    FOB present and supportive. All questions and concerns answered, family to contact [facility] services PRN.   Consult Status: NICU follow-up NICU Follow-up type: Verify absence of engorgement; Assist with IDF-2 (Mother does not need to pre-pump before breastfeeding)   Makaia Rappa Venetia Constable 02/22/2023, 3:44 PM

## 2023-02-23 ENCOUNTER — Ambulatory Visit (HOSPITAL_COMMUNITY): Payer: Self-pay

## 2023-02-23 NOTE — Lactation Note (Signed)
This note was copied from a baby's chart.  NICU Lactation Consultation Note  Patient Name: Paula Lang Date: 02/23/2023 Age:23 days  Reason for consult: Follow-up assessment; Primapara; 1st time breastfeeding; NICU baby; Breastfeeding assistance; Early term 37-38.6wks; Other (Comment) (gHTN)  SUBJECTIVE Visited with family of 47 days old ETI NICU female; Paula Lang is a P1 and reports that breastfeeding is much better today since she tried those tips for latching and positioning, she voice it was like "day and night". This LC assisted with the 12 pm feeding, baby awake and alert and latched with ease. She nursed for 10 minutes taking short breast and sustaining a NS patter throughout the majority of the feeding. Paula Lang had to take baby out in the middle of the feeding when baby started coughing (she had a let down) but baby cueing again and got back to nursing. Paula Lang are taking baby " Paula Lang" home today. Reviewed discharge education and the importance of consistent pumping after feedings at the breast for the prevention of engorgement. Ms. Ghazarian has an abundant supply and she's aware that after 10-14 days she might not need as much pumping as she's doing now. Family would like to be F/U with LC OP, referral to [facility]. sent. FOB present, very supportive and "hands on". All questions and concerns answered, family to contact [facility] services PRN.   OBJECTIVE Infant data: Mother's Current Feeding Choice: Breast Milk  Infant feeding assessment Scale for Readiness: 1 Scale for Quality: 1   Maternal data: G1P1001 Vaginal, Spontaneous Pumping frequency: 8 times/24 hours Pumped volume: 210 mL (210-280 ml; minus 2-3 oz if baby nurses prior pumping) Flange Size: Other (comment) (inserts # [id number])  Pump: Hands Free, Personal (Medela Swing double Maxi DEBP and wearable pump)  ASSESSMENT Infant: LATCH Documentation Latch: 2 Audible Swallowing: 2 Type of Nipple: 2 Comfort  (Breast/Nipple): 2 Hold (Positioning): 1 LATCH Score: 9  Feeding Status: Ad lib  Maternal: Milk volume: Abundant  INTERVENTIONS/PLAN Interventions: Interventions: Breast feeding basics reviewed; Assisted with latch; Skin to skin; Breast massage; Hand express; Breast compression; Adjust position; Support pillows; Coconut oil; DEBP; Education Discharge Education: Outpatient recommendation; Outpatient Epic message sent Tools: Pump; Flanges; Coconut oil; Hands-free pumping top (Size "L" Gray) Pump Education: Setup, frequency, and cleaning; Milk Storage  Plan: Consult Status: Complete NICU Follow-up type: Verify absence of engorgement; Assist with IDF-2 (Mother does not need to pre-pump before breastfeeding)   Zakhai Meisinger Venetia Constable 02/23/2023, 12:54 PM

## 2023-03-04 ENCOUNTER — Inpatient Hospital Stay (HOSPITAL_COMMUNITY): Payer: Managed Care, Other (non HMO)

## 2023-03-04 ENCOUNTER — Inpatient Hospital Stay (HOSPITAL_COMMUNITY)
Admission: AD | Admit: 2023-03-04 | Payer: Managed Care, Other (non HMO) | Source: Home / Self Care | Admitting: Obstetrics

## 2023-03-18 ENCOUNTER — Telehealth (HOSPITAL_COMMUNITY): Payer: Self-pay | Admitting: *Deleted

## 2023-03-18 NOTE — Telephone Encounter (Signed)
03/18/2023  Name: Paula Lang MRN: [id number] DOB: 01-20-2001  Reason for Call:  Transition of Care Hospital Discharge Call  Contact Status: Patient Contact Status: Complete  Language assistant needed: Interpreter Mode: Interpreter Not Needed        Follow-Up Questions: Do You Have Any Concerns About Your Health As You Heal From Delivery?: No Do You Have Any Concerns About Your Infants Health?: No  Edinburgh Postnatal Depression Scale:  In the Past 7 Days: I have been able to laugh and see the funny side of things.: As much as I always could I have looked forward with enjoyment to things.: As much as I ever did I have blamed myself unnecessarily when things went wrong.: Yes, some of the time I have been anxious or worried for no good reason.: Yes, very often I have felt scared or panicky for no good reason.: No, not much Things have been getting on top of me.: No, most of the time I have coped quite well I have been so unhappy that I have had difficulty sleeping.: Not at all I have felt sad or miserable.: No, not at all I have been so unhappy that I have been crying.: No, never The thought of harming myself has occurred to me.: Never Edinburgh Postnatal Depression Scale Total: 7  PHQ2-9 Depression Scale:     Discharge Follow-up: Edinburgh score requires follow up?: No Patient was advised of the following resources:: Breastfeeding Support Group, Support Group Did patient express any COVID concerns?: No  Post-discharge interventions: Reviewed Newborn Safe Sleep Practices  Salena Saner, RN 03/18/2023 15:24

## 2024-11-09 ENCOUNTER — Encounter: Admitting: Family Medicine
